# Patient Record
Sex: Female | Born: 1945 | Hispanic: Yes | Marital: Married | Smoking: Never smoker
Health system: Southern US, Community
[De-identification: ages and names within clinical notes are randomized; demographics above are authoritative.]

## PROBLEM LIST (undated history)

## (undated) DIAGNOSIS — R7303 Prediabetes: Secondary | ICD-10-CM

## (undated) DIAGNOSIS — M549 Dorsalgia, unspecified: Secondary | ICD-10-CM

## (undated) DIAGNOSIS — N189 Chronic kidney disease, unspecified: Secondary | ICD-10-CM

## (undated) DIAGNOSIS — Z86711 Personal history of pulmonary embolism: Secondary | ICD-10-CM

## (undated) HISTORY — PX: CHOLECYSTECTOMY: SHX55

---

## 2004-09-18 ENCOUNTER — Emergency Department: Payer: Self-pay | Admitting: Internal Medicine

## 2004-09-19 ENCOUNTER — Emergency Department: Payer: Self-pay | Admitting: Emergency Medicine

## 2010-02-07 ENCOUNTER — Inpatient Hospital Stay: Payer: Self-pay | Admitting: Internal Medicine

## 2010-02-14 ENCOUNTER — Other Ambulatory Visit: Payer: Self-pay | Admitting: Internal Medicine

## 2013-06-02 ENCOUNTER — Emergency Department: Payer: Self-pay | Admitting: Emergency Medicine

## 2013-06-02 LAB — URINALYSIS, COMPLETE
BACTERIA: NONE SEEN
BLOOD: NEGATIVE
Bilirubin,UR: NEGATIVE
Glucose,UR: NEGATIVE mg/dL (ref 0–75)
Ketone: NEGATIVE
Nitrite: NEGATIVE
Ph: 5 (ref 4.5–8.0)
Protein: 100
RBC,UR: 2 /HPF (ref 0–5)
Specific Gravity: 1.017 (ref 1.003–1.030)
Squamous Epithelial: 2
WBC UR: 21 /HPF (ref 0–5)

## 2013-06-02 LAB — BASIC METABOLIC PANEL
ANION GAP: 5 — AB (ref 7–16)
BUN: 35 mg/dL — ABNORMAL HIGH (ref 7–18)
CO2: 26 mmol/L (ref 21–32)
Calcium, Total: 8.5 mg/dL (ref 8.5–10.1)
Chloride: 107 mmol/L (ref 98–107)
Creatinine: 1.57 mg/dL — ABNORMAL HIGH (ref 0.60–1.30)
EGFR (African American): 40 — ABNORMAL LOW
EGFR (Non-African Amer.): 34 — ABNORMAL LOW
GLUCOSE: 86 mg/dL (ref 65–99)
OSMOLALITY: 283 (ref 275–301)
POTASSIUM: 3.7 mmol/L (ref 3.5–5.1)
SODIUM: 138 mmol/L (ref 136–145)

## 2013-06-02 LAB — CBC
HCT: 38.4 % (ref 35.0–47.0)
HGB: 12.4 g/dL (ref 12.0–16.0)
MCH: 27.3 pg (ref 26.0–34.0)
MCHC: 32.2 g/dL (ref 32.0–36.0)
MCV: 85 fL (ref 80–100)
Platelet: 213 10*3/uL (ref 150–440)
RBC: 4.54 10*6/uL (ref 3.80–5.20)
RDW: 14.3 % (ref 11.5–14.5)
WBC: 6.9 10*3/uL (ref 3.6–11.0)

## 2013-06-02 LAB — CK: CK, TOTAL: 42 U/L

## 2013-06-02 LAB — TROPONIN I: Troponin-I: <0.02 ng/mL

## 2017-04-18 ENCOUNTER — Emergency Department: Payer: Self-pay

## 2017-04-18 ENCOUNTER — Inpatient Hospital Stay
Admission: EM | Admit: 2017-04-18 | Discharge: 2017-04-20 | DRG: 194 | Disposition: A | Discharge status: Home / Self Care | Payer: Self-pay | Attending: Internal Medicine | Admitting: Internal Medicine

## 2017-04-18 ENCOUNTER — Encounter: Payer: Self-pay | Admitting: Emergency Medicine

## 2017-04-18 ENCOUNTER — Other Ambulatory Visit: Payer: Self-pay

## 2017-04-18 DIAGNOSIS — J189 Pneumonia, unspecified organism: Secondary | ICD-10-CM | POA: Diagnosis present

## 2017-04-18 DIAGNOSIS — E860 Dehydration: Secondary | ICD-10-CM | POA: Diagnosis present

## 2017-04-18 DIAGNOSIS — J101 Influenza due to other identified influenza virus with other respiratory manifestations: Secondary | ICD-10-CM | POA: Diagnosis present

## 2017-04-18 DIAGNOSIS — J1000 Influenza due to other identified influenza virus with unspecified type of pneumonia: Principal | ICD-10-CM | POA: Diagnosis present

## 2017-04-18 DIAGNOSIS — N183 Chronic kidney disease, stage 3 (moderate): Secondary | ICD-10-CM | POA: Diagnosis present

## 2017-04-18 DIAGNOSIS — Z86711 Personal history of pulmonary embolism: Secondary | ICD-10-CM

## 2017-04-18 DIAGNOSIS — Z23 Encounter for immunization: Secondary | ICD-10-CM

## 2017-04-18 DIAGNOSIS — N179 Acute kidney failure, unspecified: Secondary | ICD-10-CM | POA: Diagnosis present

## 2017-04-18 DIAGNOSIS — N189 Chronic kidney disease, unspecified: Secondary | ICD-10-CM | POA: Diagnosis present

## 2017-04-18 HISTORY — DX: Chronic kidney disease, unspecified: N18.9

## 2017-04-18 HISTORY — DX: Dorsalgia, unspecified: M54.9

## 2017-04-18 HISTORY — DX: Personal history of pulmonary embolism: Z86.711

## 2017-04-18 HISTORY — DX: Prediabetes: R73.03

## 2017-04-18 LAB — COMPREHENSIVE METABOLIC PANEL
ALBUMIN: 2.9 g/dL — AB (ref 3.5–5.0)
ALK PHOS: 236 U/L — AB (ref 38–126)
ALT: 48 U/L (ref 14–54)
AST: 66 U/L — AB (ref 15–41)
Anion gap: 8 (ref 5–15)
BILIRUBIN TOTAL: 0.6 mg/dL (ref 0.3–1.2)
BUN: 48 mg/dL — AB (ref 6–20)
CALCIUM: 8.3 mg/dL — AB (ref 8.9–10.3)
CO2: 22 mmol/L (ref 22–32)
CREATININE: 2.59 mg/dL — AB (ref 0.44–1.00)
Chloride: 103 mmol/L (ref 101–111)
GFR calc Af Amer: 21 mL/min — ABNORMAL LOW (ref >60)
GFR, EST NON AFRICAN AMERICAN: 18 mL/min — AB (ref >60)
GLUCOSE: 111 mg/dL — AB (ref 65–99)
Potassium: 4.2 mmol/L (ref 3.5–5.1)
Sodium: 133 mmol/L — ABNORMAL LOW (ref 135–145)
TOTAL PROTEIN: 7.1 g/dL (ref 6.5–8.1)

## 2017-04-18 LAB — CBC WITH DIFFERENTIAL/PLATELET
BAND NEUTROPHILS: 12 %
BASOS ABS: 0 10*3/uL (ref 0–0.1)
BASOS PCT: 0 %
BLASTS: 0 %
Eosinophils Absolute: 0 10*3/uL (ref 0–0.7)
Eosinophils Relative: 0 %
HCT: 38.8 % (ref 35.0–47.0)
HEMOGLOBIN: 12.8 g/dL (ref 12.0–16.0)
Lymphocytes Relative: 6 %
Lymphs Abs: 0.8 10*3/uL — ABNORMAL LOW (ref 1.0–3.6)
MCH: 28.2 pg (ref 26.0–34.0)
MCHC: 32.9 g/dL (ref 32.0–36.0)
MCV: 85.6 fL (ref 80.0–100.0)
METAMYELOCYTES PCT: 0 %
MONO ABS: 0.9 10*3/uL (ref 0.2–0.9)
MYELOCYTES: 0 %
Monocytes Relative: 7 %
Neutro Abs: 11.4 10*3/uL — ABNORMAL HIGH (ref 1.4–6.5)
Neutrophils Relative %: 75 %
Other: 0 %
PLATELETS: 157 10*3/uL (ref 150–440)
PROMYELOCYTES ABS: 0 %
RBC: 4.53 MIL/uL (ref 3.80–5.20)
RDW: 14.5 % (ref 11.5–14.5)
WBC: 13.1 10*3/uL — ABNORMAL HIGH (ref 3.6–11.0)
nRBC: 0 /100 WBC

## 2017-04-18 LAB — URINALYSIS, COMPLETE (UACMP) WITH MICROSCOPIC
Bacteria, UA: NONE SEEN
Bilirubin Urine: NEGATIVE
Glucose, UA: NEGATIVE mg/dL
Ketones, ur: NEGATIVE mg/dL
LEUKOCYTES UA: NEGATIVE
Nitrite: NEGATIVE
PH: 5 (ref 5.0–8.0)
Protein, ur: 100 mg/dL — AB
SPECIFIC GRAVITY, URINE: 1.019 (ref 1.005–1.030)

## 2017-04-18 LAB — LACTIC ACID, PLASMA: Lactic Acid, Venous: 1 mmol/L (ref 0.5–1.9)

## 2017-04-18 LAB — INFLUENZA PANEL BY PCR (TYPE A & B)
INFLAPCR: POSITIVE — AB
INFLBPCR: NEGATIVE

## 2017-04-18 LAB — LIPASE, BLOOD: LIPASE: 102 U/L — AB (ref 11–51)

## 2017-04-18 LAB — PROCALCITONIN: PROCALCITONIN: 0.95 ng/mL

## 2017-04-18 MED ORDER — CEFTRIAXONE SODIUM IN DEXTROSE 20 MG/ML IV SOLN
1.0000 g | INTRAVENOUS | Status: DC
  Filled 2017-04-18: qty 50

## 2017-04-18 MED ORDER — DEXTROSE 5 % IV SOLN
500.0000 mg | INTRAVENOUS | Status: DC
  Administered 2017-04-19: 500 mg via INTRAVENOUS
  Filled 2017-04-18 (×2): qty 500

## 2017-04-18 MED ORDER — SODIUM CHLORIDE 0.9 % IV BOLUS (SEPSIS)
1000.0000 mL | Freq: Once | INTRAVENOUS | Status: AC
  Administered 2017-04-18: 1000 mL via INTRAVENOUS

## 2017-04-18 MED ORDER — PNEUMOCOCCAL VAC POLYVALENT 25 MCG/0.5ML IJ INJ
0.5000 mL | INJECTION | INTRAMUSCULAR | Status: AC
  Administered 2017-04-19: 0.5 mL via INTRAMUSCULAR
  Filled 2017-04-18: qty 0.5

## 2017-04-18 MED ORDER — DEXTROSE 5 % IV SOLN
INTRAVENOUS | Status: AC
  Filled 2017-04-18: qty 500

## 2017-04-18 MED ORDER — CEFTRIAXONE SODIUM IN DEXTROSE 20 MG/ML IV SOLN
1.0000 g | Freq: Once | INTRAVENOUS | Status: AC
  Administered 2017-04-18: 1 g via INTRAVENOUS
  Filled 2017-04-18: qty 50

## 2017-04-18 MED ORDER — AZITHROMYCIN 500 MG IV SOLR
500.0000 mg | Freq: Once | INTRAVENOUS | Status: AC
  Administered 2017-04-18: 500 mg via INTRAVENOUS
  Filled 2017-04-18: qty 500

## 2017-04-18 MED ORDER — ACETAMINOPHEN 325 MG PO TABS
650.0000 mg | ORAL_TABLET | Freq: Four times a day (QID) | ORAL | Status: DC | PRN
Start: 2017-04-18 — End: 2017-04-20
  Administered 2017-04-19 – 2017-04-20 (×2): 650 mg via ORAL
  Filled 2017-04-18 (×2): qty 2

## 2017-04-18 MED ORDER — HEPARIN SODIUM (PORCINE) 5000 UNIT/ML IJ SOLN
5000.0000 [IU] | Freq: Three times a day (TID) | INTRAMUSCULAR | Status: DC
  Administered 2017-04-19 – 2017-04-20 (×4): 5000 [IU] via SUBCUTANEOUS
  Filled 2017-04-18 (×4): qty 1

## 2017-04-18 MED ORDER — ACETAMINOPHEN 650 MG RE SUPP: 650.0000 mg | Freq: Four times a day (QID) | RECTAL | Status: DC | PRN

## 2017-04-18 MED ORDER — GUAIFENESIN-DM 100-10 MG/5ML PO SYRP
5.0000 mL | ORAL_SOLUTION | ORAL | Status: DC | PRN
  Administered 2017-04-19 – 2017-04-20 (×2): 5 mL via ORAL
  Filled 2017-04-18 (×2): qty 5

## 2017-04-18 MED ORDER — IBUPROFEN 400 MG PO TABS: 400.0000 mg | ORAL_TABLET | Freq: Four times a day (QID) | ORAL | Status: DC | PRN

## 2017-04-18 MED ORDER — ONDANSETRON HCL 4 MG/2ML IJ SOLN: 4.0000 mg | Freq: Four times a day (QID) | INTRAMUSCULAR | Status: DC | PRN

## 2017-04-18 MED ORDER — SODIUM CHLORIDE 0.9 % IV SOLN
INTRAVENOUS | Status: AC
  Administered 2017-04-18: via INTRAVENOUS

## 2017-04-18 MED ORDER — INFLUENZA VAC SPLIT HIGH-DOSE 0.5 ML IM SUSY
0.5000 mL | PREFILLED_SYRINGE | INTRAMUSCULAR | Status: AC
  Administered 2017-04-19: 0.5 mL via INTRAMUSCULAR
  Filled 2017-04-18: qty 0.5

## 2017-04-18 MED ORDER — ONDANSETRON HCL 4 MG PO TABS: 4.0000 mg | ORAL_TABLET | Freq: Four times a day (QID) | ORAL | Status: DC | PRN

## 2017-04-18 NOTE — H&P (Signed)
[facility] - [place] at [facility]   PATIENT NAME: Betty ClicheMatilde Leon Silva    MR#:  [id number]  DATE OF BIRTH:  1945-10-08  DATE OF ADMISSION:  04/18/2017  PRIMARY CARE PHYSICIAN: Patient, No Pcp Per   REQUESTING/REFERRING PHYSICIAN: Scotty CourtStafford, MD  CHIEF COMPLAINT:   Chief Complaint  Patient presents with  . Fever  . Cough    HISTORY OF PRESENT ILLNESS:  Betty Silva  is a 72 y.o. female who presents with 4 days of increasing cough, chills, myalgias.  Patient found here to be influenza A positive.  Also found to have pneumonia on chest x-ray.  Hospitalist were called for admission  PAST MEDICAL HISTORY:   Past Medical History:  Diagnosis Date  . Back pain .  Marland Kitchen. CKD (chronic kidney disease)   . History of pulmonary embolism   . Prediabetes     PAST SURGICAL HISTORY:   Past Surgical History:  Procedure Laterality Date  . CHOLECYSTECTOMY      SOCIAL HISTORY:   Social History   Tobacco Use  . Smoking status: Never Smoker  . Smokeless tobacco: Never Used  Substance Use Topics  . Alcohol use: No    Frequency: Never    FAMILY HISTORY:   Family History  Problem Relation Age of Onset  . Diabetes Neg Hx   . Hypertension Neg Hx     DRUG ALLERGIES:  No Known Allergies  MEDICATIONS AT HOME:   Prior to Admission medications   Not on File    REVIEW OF SYSTEMS:  Review of Systems  Constitutional: Positive for chills and malaise/fatigue. Negative for fever and weight loss.  HENT: Negative for ear pain, hearing loss and tinnitus.   Eyes: Negative for blurred vision, double vision, pain and redness.  Respiratory: Positive for cough, sputum production and shortness of breath. Negative for hemoptysis.   Cardiovascular: Negative for chest pain, palpitations, orthopnea and leg swelling.  Gastrointestinal: Negative for abdominal pain, constipation, diarrhea, nausea and vomiting.  Genitourinary: Negative for dysuria, frequency and  hematuria.  Musculoskeletal: Positive for myalgias. Negative for back pain, joint pain and neck pain.  Skin:       No acne, rash, or lesions  Neurological: Negative for dizziness, tremors, focal weakness and weakness.  Endo/Heme/Allergies: Negative for polydipsia. Does not bruise/bleed easily.  Psychiatric/Behavioral: Negative for depression. The patient is not nervous/anxious and does not have insomnia.      VITAL SIGNS:   Vitals:   04/18/17 1618 04/18/17 1900 04/18/17 1930  BP: (!) 143/77 128/80 119/71  Pulse: (!) 104 97 90  Resp: 18    Temp: (!) 101.9 F (38.8 C)    TempSrc: Oral    SpO2: 95% 94% 96%  Weight: 65.8 kg (145 lb)     Wt Readings from Last 3 Encounters:  04/18/17 65.8 kg (145 lb)    PHYSICAL EXAMINATION:  Physical Exam  Vitals reviewed. Constitutional: She is oriented to person, place, and time. She appears well-developed and well-nourished. No distress.  HENT:  Head: Normocephalic and atraumatic.  Mouth/Throat: Oropharynx is clear and moist.  Eyes: Conjunctivae and EOM are normal. Pupils are equal, round, and reactive to light. No scleral icterus.  Neck: Normal range of motion. Neck supple. No JVD present. No thyromegaly present.  Cardiovascular: Normal rate, regular rhythm and intact distal pulses. Exam reveals no gallop and no friction rub.  No murmur heard. Respiratory: Effort normal. No respiratory distress. She has no wheezes. She has no rales.  Rhonchi  GI: Soft. Bowel sounds are normal. She exhibits no distension. There is no tenderness.  Musculoskeletal: Normal range of motion. She exhibits no edema.  No arthritis, no gout  Lymphadenopathy:    She has no cervical adenopathy.  Neurological: She is alert and oriented to person, place, and time. No cranial nerve deficit.  No dysarthria, no aphasia  Skin: Skin is warm and dry. No rash noted. No erythema.  Psychiatric: She has a normal mood and affect. Her behavior is normal. Judgment and thought  content normal.    LABORATORY PANEL:   CBC Recent Labs  Lab 04/18/17 1706  WBC 13.1*  HGB 12.8  HCT 38.8  PLT 157   ------------------------------------------------------------------------------------------------------------------  Chemistries  Recent Labs  Lab 04/18/17 1706  NA 133*  K 4.2  CL 103  CO2 22  GLUCOSE 111*  BUN 48*  CREATININE 2.59*  CALCIUM 8.3*  AST 66*  ALT 48  ALKPHOS 236*  BILITOT 0.6   ------------------------------------------------------------------------------------------------------------------  Cardiac Enzymes No results for input(s): TROPONINI in the last 168 hours. ------------------------------------------------------------------------------------------------------------------  RADIOLOGY:  Dg Chest 2 View  Result Date: 04/18/2017 CLINICAL DATA:  Productive cough, fever, and chills. EXAM: CHEST  2 VIEW COMPARISON:  06/02/2013 FINDINGS: The heart size and mediastinal contours are within normal limits. Background chronic interstitial lung disease and bibasilar scarring noted. Increased superimposed airspace opacity is seen in the right lower lobe, suspicious for pneumonia. No evidence of pleural effusion. IMPRESSION: Increased right lower lobe airspace opacity, suspicious for pneumonia. Underlying chronic interstitial lung disease and bibasilar scarring. Electronically Signed   By: Myles Rosenthal M.D.   On: 04/18/2017 17:12    EKG:  No orders found for this or any previous visit.  IMPRESSION AND PLAN:  Principal Problem:   Influenza A -patient is beyond the window for Tamiflu, will give supportive treatment Active Problems:   Acute on chronic renal failure (HCC) -likely prerenal due to infection and dehydration, gentle IV fluids tonight and avoid nephrotoxins   Secondary pneumonia -IV antibiotics, supportive treatment  All the records are reviewed and case discussed with ED provider. Management plans discussed with the patient and/or  family.  DVT PROPHYLAXIS: SubQ heparin  GI PROPHYLAXIS: None  ADMISSION STATUS: Inpatient  CODE STATUS: Full Code Status History    This patient does not have a recorded code status. Please follow your organizational policy for patients in this situation.      TOTAL TIME TAKING CARE OF THIS PATIENT: 45 minutes.   Betty Silva 04/18/2017, 10:23 PM  [facility]  Office  [phone]  CC: Primary care physician; Patient, No Pcp Per  Note:  This document was prepared using Dragon voice recognition software and may include unintentional dictation errors.

## 2017-04-18 NOTE — Progress Notes (Signed)
Pharmacy Antibiotic Note  Yanel Meriel FlavorsLeon Calderon is a 72 y.o. female admitted on 04/18/2017 with pneumonia.  Pharmacy has been consulted for ceftriaxione/azithromycin dosing.  Plan: azithromycin 500mg  iv q24h, ceftriaxone 1gm iv q24h   Weight: 145 lb (65.8 kg)  Temp (24hrs), Avg:101.9 F (38.8 C), Min:101.9 F (38.8 C), Max:101.9 F (38.8 C)  No results for input(s): WBC, CREATININE, LATICACIDVEN, VANCOTROUGH, VANCOPEAK, VANCORANDOM, GENTTROUGH, GENTPEAK, GENTRANDOM, TOBRATROUGH, TOBRAPEAK, TOBRARND, AMIKACINPEAK, AMIKACINTROU, AMIKACIN in the last 168 hours.  CrCl cannot be calculated (Patient's most recent lab result is older than the maximum 21 days allowed.).    No Known Allergies  Antimicrobials this admission: Anti-infectives (From admission, onward)   Start     Dose/Rate Route Frequency Ordered Stop   04/19/17 1600  azithromycin (ZITHROMAX) 500 mg in dextrose 5 % 250 mL IVPB     500 mg 250 mL/hr over 60 Minutes Intravenous Every 24 hours 04/18/17 1650     04/19/17 1600  cefTRIAXone (ROCEPHIN) 1 g in dextrose 5 % 50 mL IVPB - Premix     1 g 100 mL/hr over 30 Minutes Intravenous Every 24 hours 04/18/17 1650     04/18/17 1700  cefTRIAXone (ROCEPHIN) 1 g in dextrose 5 % 50 mL IVPB - Premix     1 g 100 mL/hr over 30 Minutes Intravenous  Once 04/18/17 1645     04/18/17 1700  azithromycin (ZITHROMAX) 500 mg in dextrose 5 % 250 mL IVPB     500 mg 250 mL/hr over 60 Minutes Intravenous  Once 04/18/17 1645        Microbiology results: No results found for this or any previous visit (from the past 240 hour(s)).  Thank you for allowing pharmacy to be a part of this patient's care.  Gerre PebblesGarrett Brittinie Wherley 04/18/2017 4:50 PM

## 2017-04-18 NOTE — ED Provider Notes (Signed)
[facility] Emergency Department Provider Note  ____________________________________________  Time seen: Approximately 7:03 PM  I have reviewed the triage vital signs and the nursing notes.   HISTORY  Chief Complaint Fever and Cough  Encounter completed with Spanish interpreter at bedside  HPI Betty Silva is a 72 y.o. female who complains of fever chills and productive cough for the past 2 days. She also is short of breath, worse with walking. Symptoms are constant, no aggravating or alleviating factors, moderate severity. No radiating pain.     History reviewed. No pertinent past medical history. Denies.  There are no active problems to display for this patient.    Past Surgical History:  Procedure Laterality Date  . CHOLECYSTECTOMY       Prior to Admission medications   Not on File  None   Allergies Patient has no known allergies.   No family history on file.  Social History Social History   Tobacco Use  . Smoking status: Never Smoker  . Smokeless tobacco: Never Used  Substance Use Topics  . Alcohol use: No    Frequency: Never  . Drug use: No    Review of Systems  Constitutional:   Positive fever and chills.  ENT:   No sore throat. No rhinorrhea. Cardiovascular:   No chest pain or syncope. Respiratory:   Positive shortness of breath and productive cough. Gastrointestinal:   Negative for abdominal pain, vomiting and diarrhea.  Musculoskeletal:   Negative for focal pain or swelling All other systems reviewed and are negative except as documented above in ROS and HPI.  ____________________________________________   PHYSICAL EXAM:  VITAL SIGNS: ED Triage Vitals  Enc Vitals Group     BP 04/18/17 1618 (!) 143/77     Pulse Rate 04/18/17 1618 (!) 104     Resp 04/18/17 1618 18     Temp 04/18/17 1618 (!) 101.9 F (38.8 C)     Temp Source 04/18/17 1618 Oral     SpO2 04/18/17 1618 95 %     Weight 04/18/17 1618  145 lb (65.8 kg)     Height --      Head Circumference --      Peak Flow --      Pain Score 04/18/17 1621 5     Pain Loc --      Pain Edu? --      Excl. in GC? --     Vital signs reviewed, nursing assessments reviewed.   Constitutional:   Alert and oriented. Well appearing and in no distress. Eyes:   No scleral icterus.  EOMI. No nystagmus. No conjunctival pallor. PERRL. ENT   Head:   Normocephalic and atraumatic.   Nose:   No congestion/rhinnorhea.    Mouth/Throat:   MMM, no pharyngeal erythema. No peritonsillar mass.    Neck:   No meningismus. Full ROM. Hematological/Lymphatic/Immunilogical:   No cervical lymphadenopathy. Cardiovascular:   Tachycardia heart rate 105. Symmetric bilateral radial and DP pulses.  No murmurs.  Respiratory:   Normal respiratory effort without tachypnea/retractions. Right lower lung crackles. No wheezes/rales/rhonchi. Gastrointestinal:   Soft and nontender. Non distended. There is no CVA tenderness.  No rebound, rigidity, or guarding. Genitourinary:   deferred Musculoskeletal:   Normal range of motion in all extremities. No joint effusions.  No lower extremity tenderness.  No edema. Neurologic:   Normal speech and language.  Motor grossly intact. No acute focal neurologic deficits are appreciated.  Skin:    Skin is warm, dry  and intact. No rash noted.  No petechiae, purpura, or bullae.  ____________________________________________    LABS (pertinent positives/negatives) (all labs ordered are listed, but only abnormal results are displayed) Labs Reviewed  COMPREHENSIVE METABOLIC PANEL - Abnormal; Notable for the following components:      Result Value   Sodium 133 (*)    Glucose, Bld 111 (*)    BUN 48 (*)    Creatinine, Ser 2.59 (*)    Calcium 8.3 (*)    Albumin 2.9 (*)    AST 66 (*)    Alkaline Phosphatase 236 (*)    GFR calc non Af Amer 18 (*)    GFR calc Af Amer 21 (*)    All other components within normal limits  LIPASE,  BLOOD - Abnormal; Notable for the following components:   Lipase 102 (*)    All other components within normal limits  CBC WITH DIFFERENTIAL/PLATELET - Abnormal; Notable for the following components:   WBC 13.1 (*)    Neutro Abs 11.4 (*)    Lymphs Abs 0.8 (*)    All other components within normal limits  URINALYSIS, COMPLETE (UACMP) WITH MICROSCOPIC - Abnormal; Notable for the following components:   Color, Urine YELLOW (*)    APPearance CLOUDY (*)    Hgb urine dipstick SMALL (*)    Protein, ur 100 (*)    Squamous Epithelial / LPF 0-5 (*)    All other components within normal limits  INFLUENZA PANEL BY PCR (TYPE A & B) - Abnormal; Notable for the following components:   Influenza A By PCR POSITIVE (*)    All other components within normal limits  CULTURE, BLOOD (ROUTINE X 2)  CULTURE, BLOOD (ROUTINE X 2)  URINE CULTURE  LACTIC ACID, PLASMA  LACTIC ACID, PLASMA  PROCALCITONIN   ____________________________________________   EKG    ____________________________________________    RADIOLOGY  Dg Chest 2 View  Result Date: 04/18/2017 CLINICAL DATA:  Productive cough, fever, and chills. EXAM: CHEST  2 VIEW COMPARISON:  06/02/2013 FINDINGS: The heart size and mediastinal contours are within normal limits. Background chronic interstitial lung disease and bibasilar scarring noted. Increased superimposed airspace opacity is seen in the right lower lobe, suspicious for pneumonia. No evidence of pleural effusion. IMPRESSION: Increased right lower lobe airspace opacity, suspicious for pneumonia. Underlying chronic interstitial lung disease and bibasilar scarring. Electronically Signed   By: Myles Rosenthal M.D.   On: 04/18/2017 17:12    ____________________________________________   PROCEDURES Procedures  ____________________________________________    CLINICAL IMPRESSION / ASSESSMENT AND PLAN / ED COURSE  Pertinent labs & imaging results that were available during my care of the  patient were reviewed by me and considered in my medical decision making (see chart for details).     Clinical Course as of Apr 18 2138  Sun Apr 18, 2017  1645 Pt p/w SIRS, suspected pnemonia. SO2 91% on my exam. Sepsis workup initiated  [PS]  1901 Labs show acute on chronic renal insufficiency with unremarkable electrolytes. Influenza A positive. Leukocytosis. We will discuss with the patient regarding hospitalization given her elderly age and worsening renal failure as a consultation of influenza along with her borderline low normal oxygen level.  [PS]  1909 Pro-calcitonin suggest that patient may have underlying actual pneumonia as well as influenza. Received IV antibiotics for community-acquired pneumonia. Procalcitonin: 0.95 [PS]  1954 Vital signs stable. We'll discuss with hospitalist. Procalcitonin  elevation can be indicative of severe sepsis, but currently labs of vital signs do  not reveal septic shock.  [PS]    Clinical Course User Index [PS] Sharman CheekStafford, Jovanni Rash, MD     ____________________________________________   FINAL CLINICAL IMPRESSION(S) / ED DIAGNOSES    Final diagnoses:  Acute renal failure superimposed on chronic kidney disease, unspecified CKD stage, unspecified acute renal failure type (HCC)  Influenza A       Portions of this note were generated with dragon dictation software. Dictation errors may occur despite best attempts at proofreading.    Sharman CheekStafford, Dajahnae Vondra, MD 04/18/17 2141

## 2017-04-18 NOTE — ED Triage Notes (Signed)
Pt to ED via POV states that she has been having fever, chills, and productive cough with yellow sputum. Pt able to speak in complete sentences. Pt in NAD at this time.

## 2017-04-19 LAB — CBC
HEMATOCRIT: 32.2 % — AB (ref 35.0–47.0)
Hemoglobin: 10.5 g/dL — ABNORMAL LOW (ref 12.0–16.0)
MCH: 28 pg (ref 26.0–34.0)
MCHC: 32.6 g/dL (ref 32.0–36.0)
MCV: 85.9 fL (ref 80.0–100.0)
Platelets: 140 10*3/uL — ABNORMAL LOW (ref 150–440)
RBC: 3.75 MIL/uL — ABNORMAL LOW (ref 3.80–5.20)
RDW: 14.7 % — AB (ref 11.5–14.5)
WBC: 8.9 10*3/uL (ref 3.6–11.0)

## 2017-04-19 LAB — BASIC METABOLIC PANEL
ANION GAP: 7 (ref 5–15)
BUN: 45 mg/dL — AB (ref 6–20)
CALCIUM: 7.7 mg/dL — AB (ref 8.9–10.3)
CO2: 20 mmol/L — ABNORMAL LOW (ref 22–32)
Chloride: 109 mmol/L (ref 101–111)
Creatinine, Ser: 2.44 mg/dL — ABNORMAL HIGH (ref 0.44–1.00)
GFR calc Af Amer: 22 mL/min — ABNORMAL LOW (ref >60)
GFR, EST NON AFRICAN AMERICAN: 19 mL/min — AB (ref >60)
GLUCOSE: 96 mg/dL (ref 65–99)
POTASSIUM: 4.1 mmol/L (ref 3.5–5.1)
SODIUM: 136 mmol/L (ref 135–145)

## 2017-04-19 MED ORDER — SENNOSIDES-DOCUSATE SODIUM 8.6-50 MG PO TABS
2.0000 | ORAL_TABLET | Freq: Two times a day (BID) | ORAL | Status: DC
  Administered 2017-04-19: 2 via ORAL
  Filled 2017-04-19: qty 2

## 2017-04-19 MED ORDER — SODIUM CHLORIDE 0.9 % IV SOLN
INTRAVENOUS | Status: DC
  Administered 2017-04-19 – 2017-04-20 (×2): via INTRAVENOUS

## 2017-04-19 MED ORDER — CEFTRIAXONE SODIUM 1 G IJ SOLR
1.0000 g | INTRAMUSCULAR | Status: DC
  Administered 2017-04-19: 1 g via INTRAVENOUS
  Filled 2017-04-19 (×2): qty 10

## 2017-04-19 NOTE — Progress Notes (Signed)
[facility] - [place] at [facility]   PATIENT NAME: Betty Silva    MR#:  [id number]  DATE OF BIRTH:  [birth year]  SUBJECTIVE:  CHIEF COMPLAINT:   Chief Complaint  Patient presents with  . Fever  . Cough  still coughing, grand daughter at bedside,  REVIEW OF SYSTEMS:  Review of Systems  Constitutional: Negative for chills, fever and weight loss.  HENT: Negative for nosebleeds and sore throat.   Eyes: Negative for blurred vision.  Respiratory: Positive for cough. Negative for shortness of breath and wheezing.   Cardiovascular: Negative for chest pain, orthopnea, leg swelling and PND.  Gastrointestinal: Negative for abdominal pain, constipation, diarrhea, heartburn, nausea and vomiting.  Genitourinary: Negative for dysuria and urgency.  Musculoskeletal: Negative for back pain.  Skin: Negative for rash.  Neurological: Negative for dizziness, speech change, focal weakness and headaches.  Endo/Heme/Allergies: Does not bruise/bleed easily.  Psychiatric/Behavioral: Negative for depression.   DRUG ALLERGIES:  No Known Allergies VITALS:  Blood pressure (!) 116/58, pulse 68, temperature 98.5 F (36.9 C), temperature source Oral, resp. rate 18, height 5\' 3"  (1.6 m), weight 65.2 kg (143 lb 12.8 oz), SpO2 96 %. PHYSICAL EXAMINATION:  Physical Exam  Constitutional: She is oriented to person, place, and time and well-developed, well-nourished, and in no distress.  HENT:  Head: Normocephalic and atraumatic.  Eyes: Conjunctivae and EOM are normal. Pupils are equal, round, and reactive to light.  Neck: Normal range of motion. Neck supple. No tracheal deviation present. No thyromegaly present.  Cardiovascular: Normal rate, regular rhythm and normal heart sounds.  Pulmonary/Chest: Effort normal and breath sounds normal. No respiratory distress. She has no wheezes. She exhibits no tenderness.  Abdominal: Soft. Bowel sounds are normal. She exhibits no distension.  There is no tenderness.  Musculoskeletal: Normal range of motion.  Neurological: She is alert and oriented to person, place, and time. No cranial nerve deficit.  Skin: Skin is warm and dry. No rash noted.  Psychiatric: Mood and affect normal.   LABORATORY PANEL:  Female CBC Recent Labs  Lab 04/19/17 0534  WBC 8.9  HGB 10.5*  HCT 32.2*  PLT 140*   ------------------------------------------------------------------------------------------------------------------ Chemistries  Recent Labs  Lab 04/18/17 1706 04/19/17 0534  NA 133* 136  K 4.2 4.1  CL 103 109  CO2 22 20*  GLUCOSE 111* 96  BUN 48* 45*  CREATININE 2.59* 2.44*  CALCIUM 8.3* 7.7*  AST 66*  --   ALT 48  --   ALKPHOS 236*  --   BILITOT 0.6  --    RADIOLOGY:  No results found. ASSESSMENT AND PLAN:   * Influenza A - supportive treatment * Acute on chronic renal failure (HCC) -likely prerenal due to infection and dehydration, gentle IV fluids tonight and avoid nephrotoxins - baseline creat around 1.5, now 2.44   * pneumonia - continue IV rocephin + zithromax, supportive treatment  * Thromocytopenia: monitor     All the records are reviewed and case discussed with Care Management/Social Worker. Management plans discussed with the patient, family (grand daughter at bedside)and they are in agreement.  CODE STATUS: Full Code  TOTAL TIME TAKING CARE OF THIS PATIENT: 35 minutes.   More than 50% of the time was spent in counseling/coordination of care: YES  POSSIBLE D/C IN 1 DAYS, DEPENDING ON CLINICAL CONDITION.   Delfino Lovett M.D on 04/19/2017 at 7:50 PM  Between 7am to 6pm - Pager - [phone]  After 6pm go to [email or web address] - password  [email or web address]  [facility] [place] [facility]  Office  [phone]  CC: Primary care physician; Patient, No Pcp Per  Note: This dictation was prepared with Dragon dictation along with smaller phrase technology. Any transcriptional errors that result from this  process are unintentional.

## 2017-04-20 LAB — BASIC METABOLIC PANEL
Anion gap: 5 (ref 5–15)
BUN: 39 mg/dL — AB (ref 6–20)
CALCIUM: 8.3 mg/dL — AB (ref 8.9–10.3)
CO2: 21 mmol/L — ABNORMAL LOW (ref 22–32)
CREATININE: 2.02 mg/dL — AB (ref 0.44–1.00)
Chloride: 112 mmol/L — ABNORMAL HIGH (ref 101–111)
GFR calc Af Amer: 28 mL/min — ABNORMAL LOW (ref >60)
GFR, EST NON AFRICAN AMERICAN: 24 mL/min — AB (ref >60)
GLUCOSE: 109 mg/dL — AB (ref 65–99)
Potassium: 4.2 mmol/L (ref 3.5–5.1)
SODIUM: 138 mmol/L (ref 135–145)

## 2017-04-20 LAB — CBC
HCT: 34.2 % — ABNORMAL LOW (ref 35.0–47.0)
Hemoglobin: 11.2 g/dL — ABNORMAL LOW (ref 12.0–16.0)
MCH: 28.3 pg (ref 26.0–34.0)
MCHC: 32.7 g/dL (ref 32.0–36.0)
MCV: 86.4 fL (ref 80.0–100.0)
PLATELETS: 146 10*3/uL — AB (ref 150–440)
RBC: 3.95 MIL/uL (ref 3.80–5.20)
RDW: 14.4 % (ref 11.5–14.5)
WBC: 9.7 10*3/uL (ref 3.6–11.0)

## 2017-04-20 LAB — URINE CULTURE

## 2017-04-20 MED ORDER — LEVOFLOXACIN 750 MG PO TABS: 750.0000 mg | ORAL_TABLET | Freq: Every day | ORAL | 0 refills | Status: AC

## 2017-04-20 MED ORDER — POLYETHYLENE GLYCOL 3350 17 G PO PACK
17.0000 g | PACK | ORAL | Status: AC
  Administered 2017-04-20: 17 g via ORAL
  Filled 2017-04-20: qty 1

## 2017-04-20 MED ORDER — SENNOSIDES-DOCUSATE SODIUM 8.6-50 MG PO TABS
2.0000 | ORAL_TABLET | ORAL | Status: AC
  Administered 2017-04-20: 2 via ORAL
  Filled 2017-04-20: qty 2

## 2017-04-20 NOTE — Progress Notes (Signed)
Pt in no acute distress. VSS. Pt educated on discharge education with telephone interpretor present. Pt verbalized understanding. Wheeled to visitors entrance by volunteer services and assisted into car.

## 2017-04-20 NOTE — Progress Notes (Signed)
Pharmacy Antibiotic Note  Betty Silva is a 72 y.o. female admitted on 04/18/2017 with pneumonia and influenza.  Pharmacy has been consulted for ceftriaxone + azithromycin dosing.  This is day #3 of antibiotics  Plan: Continue ceftriaxone 1 g IV daily and azithromycin 500 mg IV daily  Height: 5\' 3"  (160 cm) Weight: 143 lb 12.8 oz (65.2 kg) IBW/kg (Calculated) : 52.4  Temp (24hrs), Avg:100.7 F (38.2 C), Min:98.5 F (36.9 C), Max:102.5 F (39.2 C)  Recent Labs  Lab 04/18/17 1706 04/19/17 0534 04/20/17 0357  WBC 13.1* 8.9 9.7  CREATININE 2.59* 2.44* 2.02*  LATICACIDVEN 1.0  --   --     Estimated Creatinine Clearance: 23.9 mL/min (A) (by C-G formula based on SCr of 2.02 mg/dL (H)).    No Known Allergies  Antimicrobials this admission: ceftriaxone 1/27 >>  azithromycin 1/27 >>   Dose adjustments this admission:  Microbiology results: 1/27 BCx: No growth 2 days 1/27 UCx: Sent   Thank you for allowing pharmacy to be a part of this patient's care.  Betty Silva, PharmD, BCPS Clinical Pharmacist 04/20/2017 7:56 AM

## 2017-04-20 NOTE — Discharge Instructions (Signed)

## 2017-04-22 LAB — URINE CULTURE: Culture: NO GROWTH

## 2017-04-23 LAB — CULTURE, BLOOD (ROUTINE X 2)
Culture: NO GROWTH
Culture: NO GROWTH
SPECIMEN DESCRIPTION: ADEQUATE

## 2017-04-23 NOTE — Discharge Summary (Signed)
[facility] [place] [facility]   PATIENT NAME: Betty Silva    MR#:  [id number]  DATE OF BIRTH:  1945/03/30  DATE OF ADMISSION:  04/18/2017   ADMITTING PHYSICIAN: Oralia Manis, MD  DATE OF DISCHARGE: 04/20/2017  3:39 PM  PRIMARY CARE PHYSICIAN: Patient, No Pcp Per   ADMISSION DIAGNOSIS:  Influenza A [J10.1] Acute renal failure superimposed on chronic kidney disease, unspecified CKD stage, unspecified acute renal failure type (HCC) [N17.9, N18.9] DISCHARGE DIAGNOSIS:  Principal Problem:   Influenza A Active Problems:   Acute on chronic renal failure (HCC)   Secondary pneumonia  SECONDARY DIAGNOSIS:   Past Medical History:  Diagnosis Date  . Back pain .  Marland Kitchen CKD (chronic kidney disease)   . History of pulmonary embolism   . Prediabetes    HOSPITAL COURSE:  * Influenza A - supportive treatment, improving. *Acute on chronic kidney disease 3-likely prerenal due to infection and dehydration, improving with hydration. * pneumonia - improving. Finish 7 day course of PO levaquin * Thromocytopenia: stable DISCHARGE CONDITIONS:  stable CONSULTS OBTAINED:   DRUG ALLERGIES:  No Known Allergies DISCHARGE MEDICATIONS:   Allergies as of 04/20/2017   No Known Allergies     Medication List    TAKE these medications   levofloxacin 750 MG tablet Commonly known as:  LEVAQUIN Take 1 tablet (750 mg total) by mouth daily for 7 days.        DISCHARGE INSTRUCTIONS:   DIET:  Regular diet DISCHARGE CONDITION:  Good ACTIVITY:  Activity as tolerated OXYGEN:  Home Oxygen: No.  Oxygen Delivery: room air DISCHARGE LOCATION:  home   If you experience worsening of your admission symptoms, develop shortness of breath, life threatening emergency, suicidal or homicidal thoughts you must seek medical attention immediately by calling 911 or calling your MD immediately  if symptoms less severe.  You Must read complete instructions/literature along  with all the possible adverse reactions/side effects for all the Medicines you take and that have been prescribed to you. Take any new Medicines after you have completely understood and accpet all the possible adverse reactions/side effects.   Please note  You were cared for by a hospitalist during your hospital stay. If you have any questions about your discharge medications or the care you received while you were in the hospital after you are discharged, you can call the unit and asked to speak with the hospitalist on call if the hospitalist that took care of you is not available. Once you are discharged, your primary care physician will handle any further medical issues. Please note that NO REFILLS for any discharge medications will be authorized once you are discharged, as it is imperative that you return to your primary care physician (or establish a relationship with a primary care physician if you do not have one) for your aftercare needs so that they can reassess your need for medications and monitor your lab values.    On the day of Discharge:  VITAL SIGNS:  Blood pressure (!) 144/75, pulse 78, temperature 98.4 F (36.9 C), temperature source Oral, resp. rate 16, height 5\' 3"  (1.6 m), weight 65.2 kg (143 lb 12.8 oz), SpO2 93 %. PHYSICAL EXAMINATION:  GENERAL:  72 y.o.-year-old patient lying in the bed with no acute distress.  EYES: Pupils equal, round, reactive to light and accommodation. No scleral icterus. Extraocular muscles intact.  HEENT: Head atraumatic, normocephalic. Oropharynx and nasopharynx clear.  NECK:  Supple, no jugular venous distention.  No thyroid enlargement, no tenderness.  LUNGS: Normal breath sounds bilaterally, no wheezing, rales,rhonchi or crepitation. No use of accessory muscles of respiration.  CARDIOVASCULAR: S1, S2 normal. No murmurs, rubs, or gallops.  ABDOMEN: Soft, non-tender, non-distended. Bowel sounds present. No organomegaly or mass.  EXTREMITIES: No  pedal edema, cyanosis, or clubbing.  NEUROLOGIC: Cranial nerves II through XII are intact. Muscle strength 5/5 in all extremities. Sensation intact. Gait not checked.  PSYCHIATRIC: The patient is alert and oriented x 3.  SKIN: No obvious rash, lesion, or ulcer.  DATA REVIEW:   CBC Recent Labs  Lab 04/20/17 0357  WBC 9.7  HGB 11.2*  HCT 34.2*  PLT 146*    Chemistries  Recent Labs  Lab 04/18/17 1706  04/20/17 0357  NA 133*   < > 138  K 4.2   < > 4.2  CL 103   < > 112*  CO2 22   < > 21*  GLUCOSE 111*   < > 109*  BUN 48*   < > 39*  CREATININE 2.59*   < > 2.02*  CALCIUM 8.3*   < > 8.3*  AST 66*  --   --   ALT 48  --   --   ALKPHOS 236*  --   --   BILITOT 0.6  --   --    < > = values in this interval not displayed.     Follow-up Information    [facility]. Schedule an appointment as soon as possible for a visit in 1 week(s).   Specialty:  Primary Care Contact information: [address] [place] [id number] [phone]          Management plans discussed with the patient, family and they are in agreement.  CODE STATUS: Prior   TOTAL TIME TAKING CARE OF THIS PATIENT: 45 minutes.    Delfino LovettVipul Jandiel Magallanes M.D on 04/23/2017 at 6:52 PM  Between 7am to 6pm - Pager - [phone]  After 6pm go to [email or web address] - Social research officer, governmentpassword [email or web address]  [facility] [place] [facility]  Office  [phone]  CC: Primary care physician; Patient, No Pcp Per   Note: This dictation was prepared with Dragon dictation along with smaller phrase technology. Any transcriptional errors that result from this process are unintentional.

## 2019-08-23 IMAGING — CR DG CHEST 2V
2 series · 2 of 2 positions shown · non-contrast
Comparison: 06/02/2013

CLINICAL DATA: Productive cough, fever, and chills.

EXAM:
CHEST  2 VIEW

[chest lat]
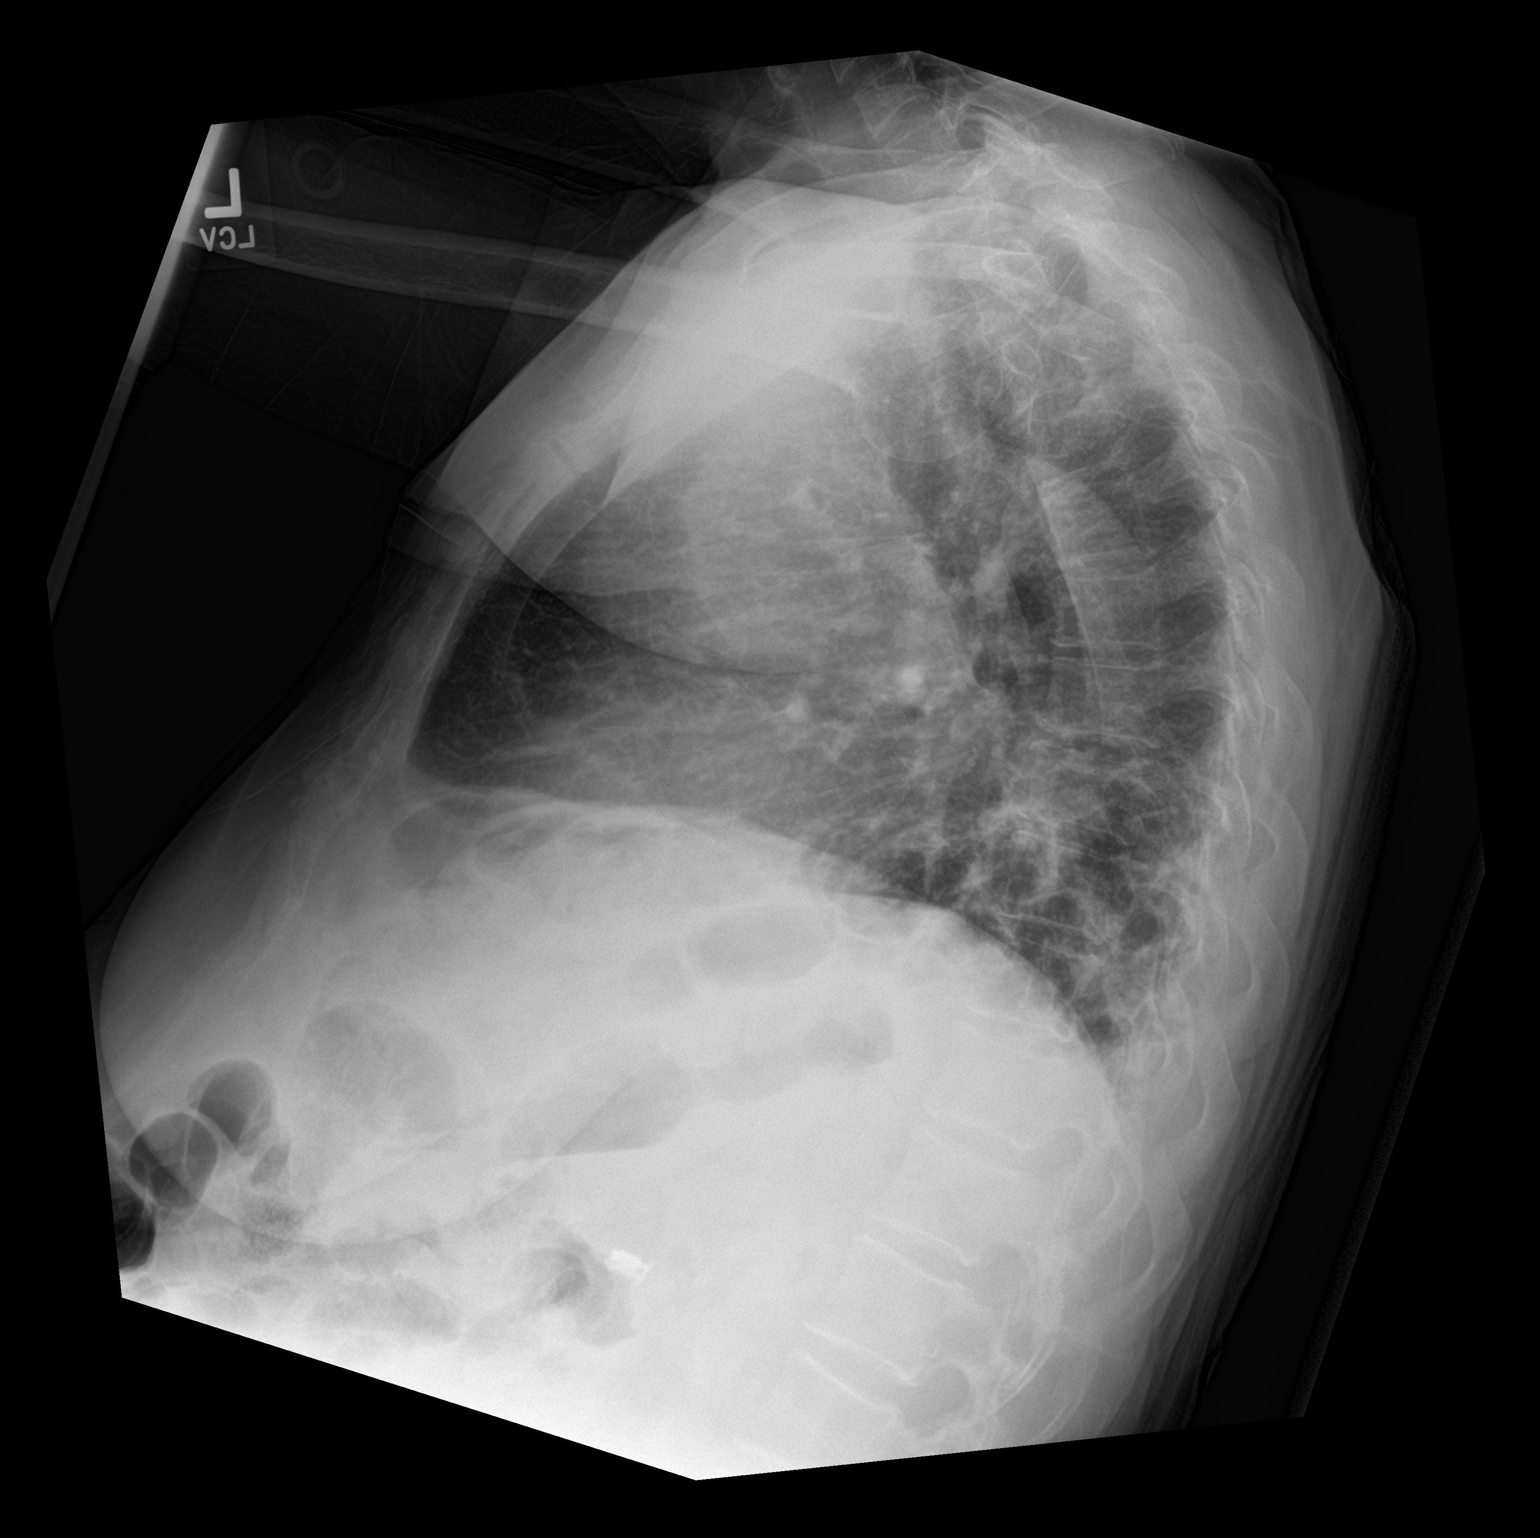

[chest ap]
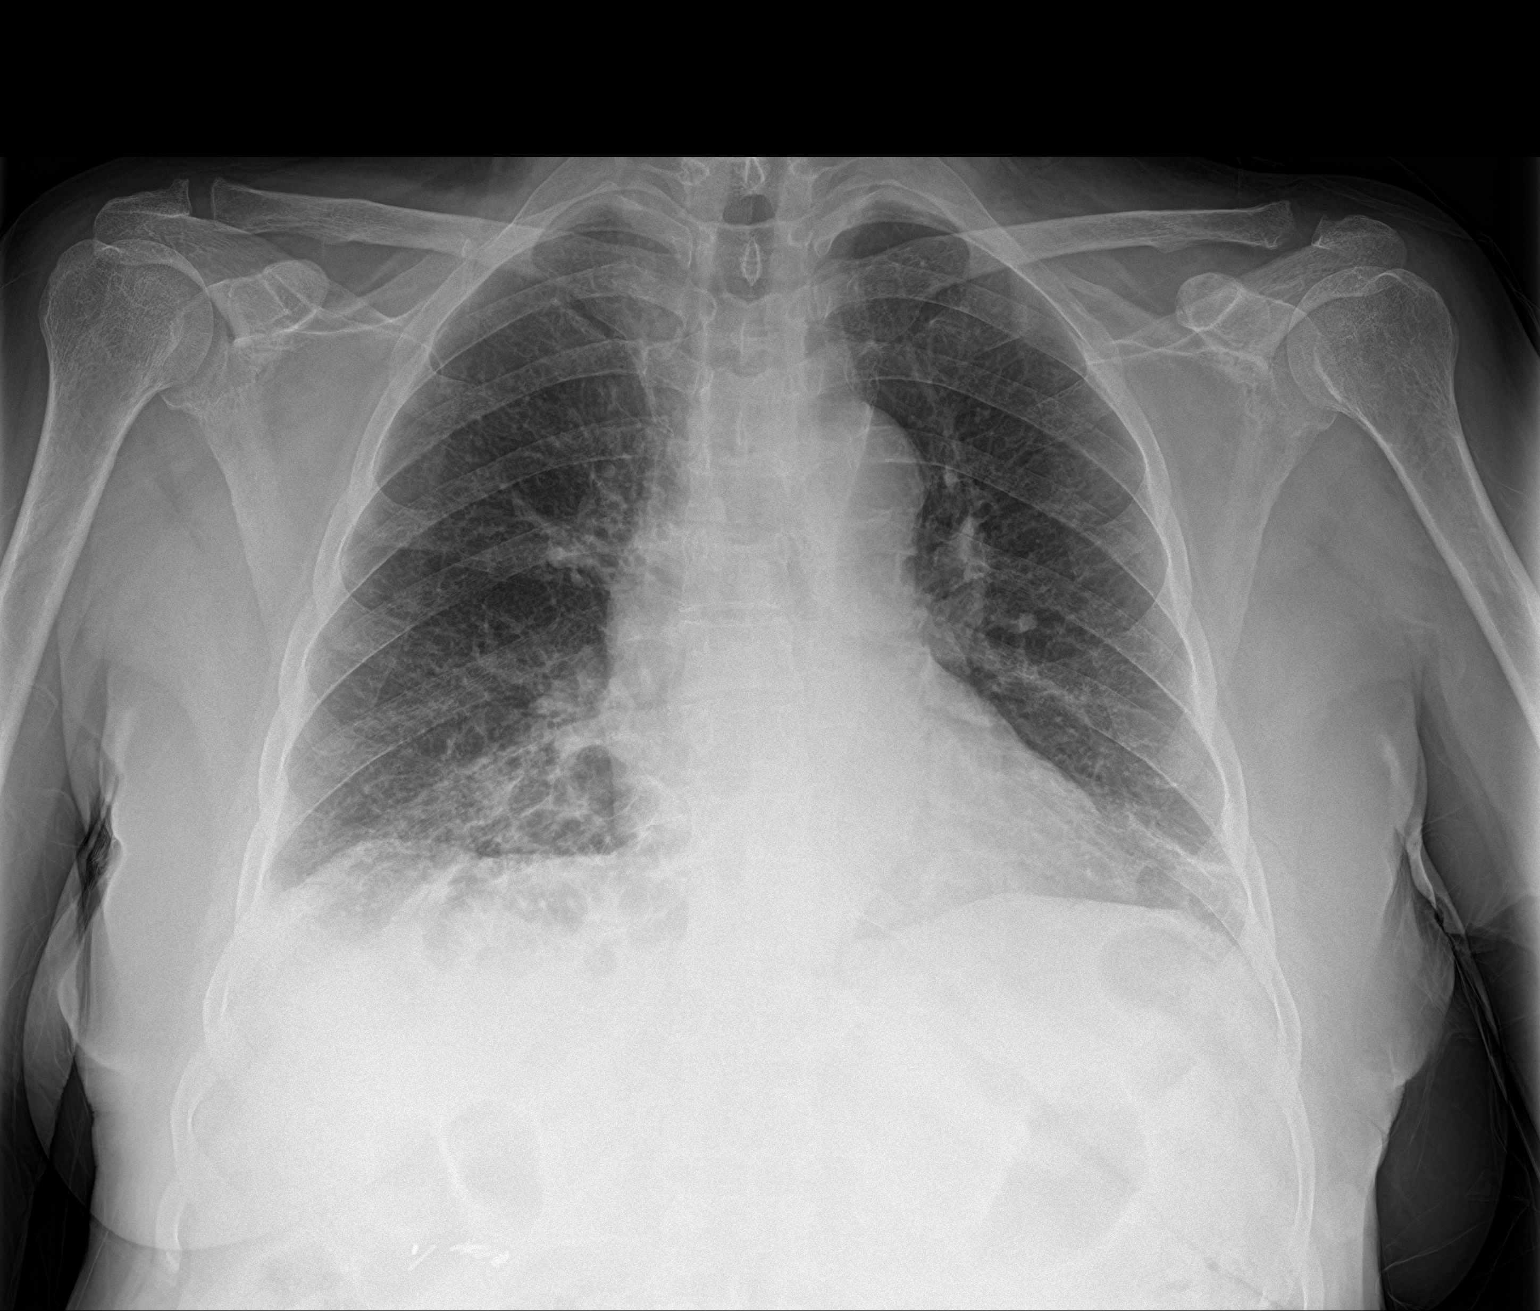

[2 of 2 positions shown; findings below may reference images not displayed]

FINDINGS: The heart size and mediastinal contours are within normal limits.
Background chronic interstitial lung disease and bibasilar scarring
noted. Increased superimposed airspace opacity is seen in the right
lower lobe, suspicious for pneumonia. No evidence of pleural
effusion.
IMPRESSION: Increased right lower lobe airspace opacity, suspicious for
pneumonia. Underlying chronic interstitial lung disease and
bibasilar scarring.

## 2022-09-29 ENCOUNTER — Emergency Department: Payer: Self-pay

## 2022-09-29 ENCOUNTER — Other Ambulatory Visit: Payer: Self-pay

## 2022-09-29 ENCOUNTER — Inpatient Hospital Stay
Admission: EM | Admit: 2022-09-29 | Discharge: 2022-10-03 | DRG: 177 | Disposition: A | Discharge status: Home / Self Care | Payer: Self-pay | Attending: Family Medicine | Admitting: Family Medicine

## 2022-09-29 ENCOUNTER — Encounter: Payer: Self-pay | Admitting: Emergency Medicine

## 2022-09-29 DIAGNOSIS — E875 Hyperkalemia: Secondary | ICD-10-CM | POA: Diagnosis present

## 2022-09-29 DIAGNOSIS — Z9049 Acquired absence of other specified parts of digestive tract: Secondary | ICD-10-CM

## 2022-09-29 DIAGNOSIS — N2581 Secondary hyperparathyroidism of renal origin: Secondary | ICD-10-CM | POA: Diagnosis present

## 2022-09-29 DIAGNOSIS — Z91158 Patient's noncompliance with renal dialysis for other reason: Secondary | ICD-10-CM

## 2022-09-29 DIAGNOSIS — D631 Anemia in chronic kidney disease: Secondary | ICD-10-CM | POA: Diagnosis present

## 2022-09-29 DIAGNOSIS — J90 Pleural effusion, not elsewhere classified: Secondary | ICD-10-CM

## 2022-09-29 DIAGNOSIS — R7303 Prediabetes: Secondary | ICD-10-CM | POA: Diagnosis present

## 2022-09-29 DIAGNOSIS — Z8616 Personal history of COVID-19: Secondary | ICD-10-CM

## 2022-09-29 DIAGNOSIS — U071 COVID-19: Principal | ICD-10-CM

## 2022-09-29 DIAGNOSIS — I10 Essential (primary) hypertension: Secondary | ICD-10-CM | POA: Insufficient documentation

## 2022-09-29 DIAGNOSIS — N186 End stage renal disease: Secondary | ICD-10-CM | POA: Insufficient documentation

## 2022-09-29 DIAGNOSIS — J189 Pneumonia, unspecified organism: Secondary | ICD-10-CM | POA: Diagnosis present

## 2022-09-29 DIAGNOSIS — Z992 Dependence on renal dialysis: Secondary | ICD-10-CM

## 2022-09-29 DIAGNOSIS — E785 Hyperlipidemia, unspecified: Secondary | ICD-10-CM | POA: Insufficient documentation

## 2022-09-29 DIAGNOSIS — J9601 Acute respiratory failure with hypoxia: Secondary | ICD-10-CM | POA: Diagnosis present

## 2022-09-29 DIAGNOSIS — E8770 Fluid overload, unspecified: Secondary | ICD-10-CM | POA: Diagnosis present

## 2022-09-29 DIAGNOSIS — Z86711 Personal history of pulmonary embolism: Secondary | ICD-10-CM

## 2022-09-29 DIAGNOSIS — J181 Lobar pneumonia, unspecified organism: Secondary | ICD-10-CM | POA: Diagnosis present

## 2022-09-29 DIAGNOSIS — I120 Hypertensive chronic kidney disease with stage 5 chronic kidney disease or end stage renal disease: Secondary | ICD-10-CM | POA: Diagnosis present

## 2022-09-29 DIAGNOSIS — J1282 Pneumonia due to coronavirus disease 2019: Secondary | ICD-10-CM | POA: Diagnosis present

## 2022-09-29 LAB — CBC
HCT: 33.9 % — ABNORMAL LOW (ref 36.0–46.0)
Hemoglobin: 10.7 g/dL — ABNORMAL LOW (ref 12.0–15.0)
MCH: 27.9 pg (ref 26.0–34.0)
MCHC: 31.6 g/dL (ref 30.0–36.0)
MCV: 88.3 fL (ref 80.0–100.0)
Platelets: 166 10*3/uL (ref 150–400)
RBC: 3.84 MIL/uL — ABNORMAL LOW (ref 3.87–5.11)
RDW: 16.7 % — ABNORMAL HIGH (ref 11.5–15.5)
WBC: 7 10*3/uL (ref 4.0–10.5)
nRBC: 0 % (ref 0.0–0.2)

## 2022-09-29 LAB — BASIC METABOLIC PANEL
Anion gap: 11 (ref 5–15)
BUN: 28 mg/dL — ABNORMAL HIGH (ref 8–23)
CO2: 25 mmol/L (ref 22–32)
Calcium: 8.8 mg/dL — ABNORMAL LOW (ref 8.9–10.3)
Chloride: 99 mmol/L (ref 98–111)
Creatinine, Ser: 7.51 mg/dL — ABNORMAL HIGH (ref 0.44–1.00)
GFR, Estimated: 5 mL/min — ABNORMAL LOW (ref >60)
Glucose, Bld: 93 mg/dL (ref 70–99)
Potassium: 5.2 mmol/L — ABNORMAL HIGH (ref 3.5–5.1)
Sodium: 135 mmol/L (ref 135–145)

## 2022-09-29 LAB — BRAIN NATRIURETIC PEPTIDE: B Natriuretic Peptide: 4176.9 pg/mL — ABNORMAL HIGH (ref 0.0–100.0)

## 2022-09-29 LAB — HEPATIC FUNCTION PANEL
ALT: 10 U/L (ref 0–44)
AST: 22 U/L (ref 15–41)
Albumin: 2.6 g/dL — ABNORMAL LOW (ref 3.5–5.0)
Alkaline Phosphatase: 100 U/L (ref 38–126)
Bilirubin, Direct: 0.2 mg/dL (ref 0.0–0.2)
Indirect Bilirubin: 0.5 mg/dL (ref 0.3–0.9)
Total Bilirubin: 0.7 mg/dL (ref 0.3–1.2)
Total Protein: 5.9 g/dL — ABNORMAL LOW (ref 6.5–8.1)

## 2022-09-29 LAB — BLOOD GAS, ARTERIAL
Acid-Base Excess: 0.8 mmol/L (ref 0.0–2.0)
Bicarbonate: 27.1 mmol/L (ref 20.0–28.0)
O2 Content: 4 L/min
O2 Saturation: 86.6 %
Patient temperature: 37
pCO2 arterial: 49 mmHg — ABNORMAL HIGH (ref 32–48)
pH, Arterial: 7.35 (ref 7.35–7.45)
pO2, Arterial: 58 mmHg — ABNORMAL LOW (ref 83–108)

## 2022-09-29 LAB — RESP PANEL BY RT-PCR (RSV, FLU A&B, COVID)  RVPGX2
Influenza A by PCR: NEGATIVE
Influenza B by PCR: NEGATIVE
Resp Syncytial Virus by PCR: NEGATIVE
SARS Coronavirus 2 by RT PCR: POSITIVE — AB

## 2022-09-29 LAB — PROCALCITONIN: Procalcitonin: 0.42 ng/mL

## 2022-09-29 LAB — GLUCOSE, CAPILLARY: Glucose-Capillary: 98 mg/dL (ref 70–99)

## 2022-09-29 LAB — LIPASE, BLOOD: Lipase: 23 U/L (ref 11–51)

## 2022-09-29 LAB — STREP PNEUMONIAE URINARY ANTIGEN: Strep Pneumo Urinary Antigen: NEGATIVE

## 2022-09-29 LAB — TROPONIN I (HIGH SENSITIVITY)
Troponin I (High Sensitivity): 10 ng/L (ref <18)
Troponin I (High Sensitivity): 11 ng/L (ref <18)

## 2022-09-29 LAB — MRSA NEXT GEN BY PCR, NASAL: MRSA by PCR Next Gen: NOT DETECTED

## 2022-09-29 LAB — FERRITIN: Ferritin: 235 ng/mL (ref 11–307)

## 2022-09-29 MED ORDER — IPRATROPIUM-ALBUTEROL 0.5-2.5 (3) MG/3ML IN SOLN
3.0000 mL | RESPIRATORY_TRACT | Status: DC
  Administered 2022-09-29 (×3): 3 mL via RESPIRATORY_TRACT
  Filled 2022-09-29 (×3): qty 3

## 2022-09-29 MED ORDER — METHYLPREDNISOLONE SODIUM SUCC 40 MG IJ SOLR: 40.0000 mg | Freq: Two times a day (BID) | INTRAMUSCULAR | Status: DC

## 2022-09-29 MED ORDER — BUDESONIDE 0.5 MG/2ML IN SUSP
0.5000 mg | Freq: Two times a day (BID) | RESPIRATORY_TRACT | Status: DC
  Administered 2022-09-29 – 2022-09-30 (×3): 0.5 mg via RESPIRATORY_TRACT
  Filled 2022-09-29 (×3): qty 2

## 2022-09-29 MED ORDER — IOHEXOL 350 MG/ML SOLN
75.0000 mL | Freq: Once | INTRAVENOUS | Status: AC | PRN
  Administered 2022-09-29: 75 mL via INTRAVENOUS

## 2022-09-29 MED ORDER — AMLODIPINE BESYLATE 5 MG PO TABS
5.0000 mg | ORAL_TABLET | Freq: Every day | ORAL | Status: DC
  Administered 2022-09-29 – 2022-10-03 (×5): 5 mg via ORAL
  Filled 2022-09-29 (×5): qty 1

## 2022-09-29 MED ORDER — PENTAFLUOROPROP-TETRAFLUOROETH EX AERO
1.0000 | INHALATION_SPRAY | CUTANEOUS | Status: DC | PRN
  Administered 2022-10-03: 1 via TOPICAL
  Filled 2022-09-29: qty 30

## 2022-09-29 MED ORDER — SODIUM CHLORIDE 0.9 % IV SOLN
100.0000 mg | Freq: Every day | INTRAVENOUS | Status: AC
  Administered 2022-09-30 – 2022-10-01 (×2): 100 mg via INTRAVENOUS
  Filled 2022-09-29 (×2): qty 20

## 2022-09-29 MED ORDER — FERROUS SULFATE 325 (65 FE) MG PO TABS
325.0000 mg | ORAL_TABLET | Freq: Every day | ORAL | Status: DC
  Administered 2022-09-30 – 2022-10-03 (×4): 325 mg via ORAL
  Filled 2022-09-29 (×5): qty 1

## 2022-09-29 MED ORDER — IPRATROPIUM-ALBUTEROL 0.5-2.5 (3) MG/3ML IN SOLN
3.0000 mL | Freq: Four times a day (QID) | RESPIRATORY_TRACT | Status: DC
  Administered 2022-09-30: 3 mL via RESPIRATORY_TRACT
  Filled 2022-09-29: qty 3

## 2022-09-29 MED ORDER — LINEZOLID 600 MG/300ML IV SOLN
600.0000 mg | Freq: Two times a day (BID) | INTRAVENOUS | Status: DC
  Administered 2022-09-29 – 2022-09-30 (×3): 600 mg via INTRAVENOUS
  Filled 2022-09-29 (×4): qty 300

## 2022-09-29 MED ORDER — LIDOCAINE HCL (PF) 1 % IJ SOLN: 5.0000 mL | INTRAMUSCULAR | Status: DC | PRN

## 2022-09-29 MED ORDER — SODIUM CHLORIDE 0.9 % IV SOLN
200.0000 mg | Freq: Once | INTRAVENOUS | Status: AC
  Administered 2022-09-29: 200 mg via INTRAVENOUS
  Filled 2022-09-29: qty 40

## 2022-09-29 MED ORDER — VITAMIN C 500 MG PO TABS
500.0000 mg | ORAL_TABLET | Freq: Every day | ORAL | Status: DC
  Administered 2022-09-29 – 2022-10-03 (×5): 500 mg via ORAL
  Filled 2022-09-29 (×5): qty 1

## 2022-09-29 MED ORDER — SODIUM CHLORIDE 0.9 % IV SOLN
1.0000 g | Freq: Once | INTRAVENOUS | Status: AC
  Administered 2022-09-29: 1 g via INTRAVENOUS
  Filled 2022-09-29: qty 10

## 2022-09-29 MED ORDER — PRAVASTATIN SODIUM 20 MG PO TABS
20.0000 mg | ORAL_TABLET | Freq: Every day | ORAL | Status: DC
  Administered 2022-09-29 – 2022-10-02 (×4): 20 mg via ORAL
  Filled 2022-09-29 (×4): qty 1

## 2022-09-29 MED ORDER — ALTEPLASE 2 MG IJ SOLR: 2.0000 mg | Freq: Once | INTRAMUSCULAR | Status: DC | PRN

## 2022-09-29 MED ORDER — CHLORHEXIDINE GLUCONATE CLOTH 2 % EX PADS
6.0000 | MEDICATED_PAD | Freq: Every day | CUTANEOUS | Status: DC
  Administered 2022-09-29 – 2022-10-03 (×5): 6 via TOPICAL
  Filled 2022-09-29: qty 6

## 2022-09-29 MED ORDER — DEXAMETHASONE SODIUM PHOSPHATE 10 MG/ML IJ SOLN
6.0000 mg | INTRAMUSCULAR | Status: DC
  Administered 2022-09-29: 6 mg via INTRAVENOUS
  Filled 2022-09-29: qty 1

## 2022-09-29 MED ORDER — ANTICOAGULANT SODIUM CITRATE 4% (200MG/5ML) IV SOLN: 5.0000 mL | Status: DC | PRN

## 2022-09-29 MED ORDER — SODIUM CHLORIDE 0.9 % IV SOLN
500.0000 mg | Freq: Once | INTRAVENOUS | Status: AC
  Administered 2022-09-29: 500 mg via INTRAVENOUS
  Filled 2022-09-29: qty 5

## 2022-09-29 MED ORDER — ZINC SULFATE 220 (50 ZN) MG PO CAPS
220.0000 mg | ORAL_CAPSULE | Freq: Every day | ORAL | Status: DC
  Administered 2022-09-29 – 2022-10-03 (×4): 220 mg via ORAL
  Filled 2022-09-29 (×5): qty 1

## 2022-09-29 MED ORDER — HEPARIN SODIUM (PORCINE) 1000 UNIT/ML DIALYSIS: 1000.0000 [IU] | INTRAMUSCULAR | Status: DC | PRN

## 2022-09-29 MED ORDER — LIDOCAINE-PRILOCAINE 2.5-2.5 % EX CREA: 1.0000 | TOPICAL_CREAM | CUTANEOUS | Status: DC | PRN

## 2022-09-29 MED ORDER — IPRATROPIUM-ALBUTEROL 0.5-2.5 (3) MG/3ML IN SOLN: 3.0000 mL | RESPIRATORY_TRACT | Status: DC | PRN

## 2022-09-29 MED ORDER — METHYLPREDNISOLONE SODIUM SUCC 125 MG IJ SOLR
80.0000 mg | INTRAMUSCULAR | Status: DC
  Administered 2022-09-29 – 2022-09-30 (×2): 80 mg via INTRAVENOUS
  Filled 2022-09-29 (×2): qty 2

## 2022-09-29 MED ORDER — HEPARIN SODIUM (PORCINE) 5000 UNIT/ML IJ SOLN
5000.0000 [IU] | Freq: Three times a day (TID) | INTRAMUSCULAR | Status: DC
  Administered 2022-09-29 – 2022-10-03 (×12): 5000 [IU] via SUBCUTANEOUS
  Filled 2022-09-29 (×12): qty 1

## 2022-09-29 MED ORDER — CINACALCET HCL 30 MG PO TABS
30.0000 mg | ORAL_TABLET | Freq: Every day | ORAL | Status: DC
  Administered 2022-09-30 – 2022-10-03 (×4): 30 mg via ORAL
  Filled 2022-09-29 (×5): qty 1

## 2022-09-29 NOTE — ED Provider Notes (Signed)
[facility] Provider Note    Event Date/Time   First MD Initiated Contact with Patient 09/29/22 0825     (approximate)   History   Chief Complaint Chest Pain and Abdominal Pain   HPI  Betty Silva is a 77 y.o. female with past medical history of hypertension, ESRD on HD (TTS), and PE who presents to the ED complaining of chest pain.  History is limited as patient is Spanish-speaking only, history obtained with assistance of in person interpreter.  Patient reports that she has been dealing with sharp pain in her chest and upper back since yesterday.  She denies any difficulty breathing, but has had an increase in her chronic cough.  She also complains of swelling in both of her legs.  She is due for dialysis later this morning, has not missed any previous sessions.     Physical Exam   Triage Vital Signs: ED Triage Vitals  Enc Vitals Group     BP 09/29/22 0739 (!) 154/69     Pulse Rate 09/29/22 0739 92     Resp 09/29/22 0739 18     Temp 09/29/22 0739 98.7 F (37.1 C)     Temp Source 09/29/22 0739 Oral     SpO2 09/29/22 0739 93 %     Weight 09/29/22 0736 143 lb 11.8 oz (65.2 kg)     Height 09/29/22 0736 5\' 3"  (1.6 m)     Head Circumference --      Peak Flow --      Pain Score 09/29/22 0738 10     Pain Loc --      Pain Edu? --      Excl. in GC? --     Most recent vital signs: Vitals:   09/29/22 0739  BP: (!) 154/69  Pulse: 92  Resp: 18  Temp: 98.7 F (37.1 C)  SpO2: 93%    Constitutional: Alert and oriented. Eyes: Conjunctivae are normal. Head: Atraumatic. Nose: No congestion/rhinnorhea. Mouth/Throat: Mucous membranes are moist.  Cardiovascular: Normal rate, regular rhythm. Grossly normal heart sounds.  2+ radial pulses bilaterally.  Left upper extremity AV fistula with palpable thrill. Respiratory: Tachypneic with increased respiratory effort.  No retractions. Lungs with crackles throughout bilaterally Gastrointestinal: Soft  and nontender. No distention. Musculoskeletal: No lower extremity tenderness, 1+ pitting edema to knees bilaterally. Neurologic:  Normal speech and language. No gross focal neurologic deficits are appreciated.    ED Results / Procedures / Treatments   Labs (all labs ordered are listed, but only abnormal results are displayed) Labs Reviewed  RESP PANEL BY RT-PCR (RSV, FLU A&B, COVID)  RVPGX2 - Abnormal; Notable for the following components:      Result Value   SARS Coronavirus 2 by RT PCR POSITIVE (*)    All other components within normal limits  BASIC METABOLIC PANEL - Abnormal; Notable for the following components:   Potassium 5.2 (*)    BUN 28 (*)    Creatinine, Ser 7.51 (*)    Calcium 8.8 (*)    GFR, Estimated 5 (*)    All other components within normal limits  CBC - Abnormal; Notable for the following components:   RBC 3.84 (*)    Hemoglobin 10.7 (*)    HCT 33.9 (*)    RDW 16.7 (*)    All other components within normal limits  HEPATIC FUNCTION PANEL - Abnormal; Notable for the following components:   Total Protein 5.9 (*)    Albumin 2.6 (*)  All other components within normal limits  LIPASE, BLOOD  PROCALCITONIN  TROPONIN I (HIGH SENSITIVITY)  TROPONIN I (HIGH SENSITIVITY)     EKG  ED ECG REPORT I, Chesley Noon, the attending physician, personally viewed and interpreted this ECG.   Date: 09/29/2022  EKG Time: 7:43  Rate: 88  Rhythm: normal sinus rhythm  Axis: LAD  Intervals:none  ST&T Change: None  RADIOLOGY CXR reviewed and interpreted by me with patchy bilateral infiltrates and small bilateral effusions.  PROCEDURES:  Critical Care performed: Yes, see critical care procedure note(s)  .Critical Care  Performed by: Chesley Noon, MD Authorized by: Chesley Noon, MD   Critical care provider statement:    Critical care time (minutes):  30   Critical care time was exclusive of:  Separately billable procedures and treating other patients and  teaching time   Critical care was necessary to treat or prevent imminent or life-threatening deterioration of the following conditions:  Respiratory failure   Critical care was time spent personally by me on the following activities:  Development of treatment plan with patient or surrogate, discussions with consultants, evaluation of patient's response to treatment, examination of patient, ordering and review of laboratory studies, ordering and review of radiographic studies, ordering and performing treatments and interventions, pulse oximetry, re-evaluation of patient's condition and review of old charts   I assumed direction of critical care for this patient from another provider in my specialty: no     Care discussed with: admitting provider      MEDICATIONS ORDERED IN ED: Medications  ceFEPIme (MAXIPIME) 1 g in sodium chloride 0.9 % 100 mL IVPB (has no administration in time range)  azithromycin (ZITHROMAX) 500 mg in sodium chloride 0.9 % 250 mL IVPB (has no administration in time range)  cinacalcet (SENSIPAR) tablet 30 mg (has no administration in time range)  amLODipine (NORVASC) tablet 5 mg (has no administration in time range)  ferrous sulfate tablet 325 mg (has no administration in time range)  iohexol (OMNIPAQUE) 350 MG/ML injection 75 mL (75 mLs Intravenous Contrast Given 09/29/22 0922)     IMPRESSION / MDM / ASSESSMENT AND PLAN / ED COURSE  I reviewed the triage vital signs and the nursing notes.                              77 y.o. female with past medical history of hypertension, ESRD on HD (TTS), and PE not currently on anticoagulation who presents to the ED complaining of constant sharp pain in her chest and upper back since yesterday with associated acute on chronic cough.  Patient's presentation is most consistent with acute presentation with potential threat to life or bodily function.  Differential diagnosis includes, but is not limited to, ACS, PE, dissection,  pneumonia, pneumothorax, bronchitis, COVID-19, musculoskeletal pain, pulmonary edema, electrolyte abnormality, anemia.  Patient chronically ill but nontoxic-appearing and in no acute distress.  She denies any difficulty breathing but does have increased work of breathing on exam with crackles throughout.  She appears fluid overloaded and chest x-ray shows multifocal infiltrates that could be pulmonary edema, but with her increased cough I am also concerned for pneumonia.  No findings concerning for sepsis at this time, with her history of PE not on anticoagulation will also check CTA of her chest to rule out PE.  EKG shows no evidence of arrhythmia or ischemia and troponin within normal limits, low suspicion for ACS.  Renal function consistent with  known ESRD, she does have some mild hyperkalemia with this, no significant anemia or leukocytosis noted.  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  Patient with loculated effusions and inflammatory changes concerning for pneumonia, will start on cefepime and azithromycin.  On reassessment, patient's oxygen saturations at 89% on room air, she was placed on 2 L nasal cannula with improvement.  Case discussed with hospitalist for admission, I also spoke with Dr. Cherylann Ratel to arrange for dialysis.      FINAL CLINICAL IMPRESSION(S) / ED DIAGNOSES   Final diagnoses:  Pneumonia due to infectious organism, unspecified laterality, unspecified part of lung  Pleural effusion  ESRD on hemodialysis (HCC)  COVID-19     Rx / DC Orders   ED Discharge Orders     None        Note:  This document was prepared using Dragon voice recognition software and may include unintentional dictation errors.   Chesley Noon, MD 09/29/22 1124

## 2022-09-29 NOTE — ED Triage Notes (Signed)
Family states today is pt's day for dialysis also, appt time is 1045.

## 2022-09-29 NOTE — Progress Notes (Signed)
1915 patient alert to self family at bedside able to help translate to patient alert to self baseline per family. No pain noted. HD present to start patients treatment.  09/30/22 0000 patient tolerated HD well ADL care done after 0530 CHG and bath given patient tolerated well able to move self well in bed

## 2022-09-29 NOTE — ED Triage Notes (Signed)
Pt here with cp that started yesterday but has gotten worse over time. Pt states her entire chest and back is hurting and the pain is constant. Pt denies NVD.

## 2022-09-29 NOTE — ED Notes (Signed)
See triage notes. Patient's family stated the patient began complaining of chest and all over body pain yesterday. Patient woke this morning with it being worse. Family stated the patient always has a cough.

## 2022-09-29 NOTE — Assessment & Plan Note (Signed)
COVID-19 positive in the setting of acute respiratory failure with hypoxia and multiloculated basilar pneumonia IV Decadron Remdesivir Trend inflammatory markers As needed DuoNebs Follow closely

## 2022-09-29 NOTE — Assessment & Plan Note (Signed)
Continue statin. 

## 2022-09-29 NOTE — ED Notes (Signed)
Ambulated to b/r with family    O2 sats dropped to 89-90%  resps remain labored

## 2022-09-29 NOTE — Assessment & Plan Note (Signed)
Baseline ESRD on hemodialysis TTS Missed hemodialysis today given respiratory failure In conversation with Dr. Larinda Buttery, nephrology has already been consulted Follow

## 2022-09-29 NOTE — Assessment & Plan Note (Signed)
Decompensated respiratory status now requiring 2 L nasal cannula with noted cough, shortness of breath, malaise of multiple days  CTA chest with noted right lower lobe multiloculated pneumonia Patient also COVID-positive Will place on IV Decadron and remdesivir for COVID coverage Rocephin azithromycin for pneumonia coverage Case also discussed with pulmonology given multiloculated nature of pneumonia on CT of the chest They will formally evaluate  Follow-up formal recommendations

## 2022-09-29 NOTE — Progress Notes (Signed)
Patient alert to self. Attempted to use tele-interpreter at bedside for admission, patient confused and having difficult time understanding. Patient seems to understand some  but is very confused and does not follow conversation. Per family, patient does not understand "spanish/spanish". Per family patient is always confused at baseline, patient alternates living with family members monthly. Called interpreter to bedside, patient speaks [place] per interpreter.  Patient pressures stable, on 2L [place], resting comfortably in bed. Family at bedside. Will continue to monitor.

## 2022-09-29 NOTE — Progress Notes (Signed)
Received consult for this patient dialysis for fluid overload and mildly elevated potassium. Patient found to be Covid positive and must be completed as a bedside. Orders placed and patient will receive treatment this evening. Will provide formal consult tomorrow.

## 2022-09-29 NOTE — Progress Notes (Signed)
PHARMACIST - PHYSICIAN COMMUNICATION   CONCERNING: Methylprednisolone IV    Current order: Methylprednisolone IV 40 mg q12h     DESCRIPTION: Per [facility] Protocol:   IV methylprednisolone will be converted to either a q12h or q24h frequency with the same total daily dose (TDD).  Ordered Dose: 1 to 125 mg TDD; convert to: TDD q24h.  Ordered Dose: 126 to 250 mg TDD; convert to: TDD div q12h.  Ordered Dose: >250 mg TDD; DAW.  Order has been adjusted to: Methylprednisolone IV 80 mg q24h   Gardner Candle , PharmD, BCPS Clinical Pharmacist  09/29/2022 1:49 PM

## 2022-09-29 NOTE — Consult Note (Signed)
NAME:  Betty Silva, MRN:  [id number], DOB:  07-04-1945, LOS: 0 ADMISSION DATE:  09/29/2022, CONSULTATION DATE:  09/29/2022 REFERRING MD:  Dr. Alvester Morin, CHIEF COMPLAINT:  Chest & Abdominal pain, cough   Brief Pt Description / Synopsis:  77 y.o. female with PMHx of ESRD on HD (TTS), admitted with Acute Hypoxic Respiratory Failure in the setting of COVID-19 infection, suspected superimposed necrotizing bacterial pneumonia, concern for bilateral loculated pleural effusions, & volume overload.  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  Patient is Spanish-speaking only, therefore interview performed via remote interpreter with daughter and granddaughter at bedside.  Per family she has been in her normal state of health until yesterday afternoon when she developed acute onset of chest/abdominal pain with coughing, and fever.  She denies any sputum production, hemoptysis, chills, nausea, vomiting, diarrhea, dysuria, edema, sick contacts.  She also reports she has been compliant with her outpatient hemodialysis sessions, of which her last session was performed last Saturday and she is due for her Tuesday session today.  ED Course: Initial Vital Signs: Temperature 98.7 F orally, pulse 92, respiratory 18, blood pressure 154/69, SpO2 89% on room air Significant Labs: Potassium 5.2, BUN 28, creatinine 7.5, BNP 4176, high-sensitivity troponin negative x 2, WBC 7.0, procalcitonin 0.42, hemoglobin 10.7 COVID-19 PCR is positive Imaging Chest X-ray>>IMPRESSION: 1. Increased patchy opacities of the bilateral lower lobes and right upper lobe. Recommend follow-up PA and lateral chest radiograph in 6-8 weeks to ensure resolution. 2. New small  left-greater-than-right pleural effusions. CT Angio Chest>>IMPRESSION: 1. Ill-defined filling defects in the right lower lobe segmental/subsegmental pulmonary arteries, likely due to slow flow given associated chronic lung disease of the right lower lobe. No evidence acute pulmonary embolus. 2. 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 3. Loculated trace left-greater-than-right pleural effusions with right fissural fluid. 4. Multiloculated cystic lesion of the right lower lobe with slightly increased wall thickening and new air-fluid level, concerning for superimposed infection. 5. Mild bilateral bronchial wall thickening, findings can be seen in the setting of bronchitis. 6.  Aortic Atherosclerosis (ICD10-I70.0). Medications Administered: Azithromycin, Cefepime  TRH asked to admit for further workup and treatment.  PCCM asked to see for Pulmonary Consultation and for high risk of decompensation.    Please see "Significant Hospital Events" section below for full detailed hospital course.  Pertinent  Medical History   Past Medical History:  Diagnosis Date   Back pain .   CKD (chronic kidney disease)    History of pulmonary embolism    Prediabetes      Micro Data:  7/9: COVID-19 PCR>> positive 7/9: Strep pneumo urinary antigen>> 7/9: Legionella urine antigen>> 7/9: Mycoplasma pneumoniae antibody>> 7/9: MRSA PCR>> 7/9: Sputum>>  Antimicrobials:   Anti-infectives (From admission, onward)    Start     Dose/Rate Route Frequency Ordered Stop   09/30/22 1000  remdesivir 100 mg in sodium chloride 0.9 % 100 mL IVPB       See Hyperspace for full Linked Orders Report.   100 mg 200 mL/hr over 30 Minutes Intravenous Daily 09/29/22 1222 10/02/22 0959   09/29/22 1500  remdesivir 200 mg in sodium chloride 0.9% 250 mL IVPB       See Hyperspace for full  Linked Orders Report.   200 mg 580 mL/hr over 30  Minutes Intravenous Once 09/29/22 1222     09/29/22 1330  linezolid (ZYVOX) IVPB 600 mg        600 mg 300 mL/hr over 60 Minutes Intravenous Every 12 hours 09/29/22 1253     09/29/22 1115  ceFEPIme (MAXIPIME) 1 g in sodium chloride 0.9 % 100 mL IVPB        1 g 200 mL/hr over 30 Minutes Intravenous  Once 09/29/22 1104     09/29/22 1115  azithromycin (ZITHROMAX) 500 mg in sodium chloride 0.9 % 250 mL IVPB        500 mg 250 mL/hr over 60 Minutes Intravenous  Once 09/29/22 1104          Significant Hospital Events: Including procedures, antibiotic start and stop dates in addition to other pertinent events   7/9: Admitted by [facility].  PCCM consulted for pulmonary consult and due to high risk of decompensation.  Nephrology consulted, tentative plan for HD today  Interim History / Subjective:  -Patient seen and evaluated in the ED -On 2 L supplemental O2, however noted to have mild respiratory distress and accessory muscle use with audible expiratory wheezing -Will changed Decadron to Solumedrol 40 mg BID -High risk for further decompensation and need for intubation -Concern for possible necrotizing pneumonia, will broaden antibiotics to include linezolid -Nephrology has been contacted, plan for HD today  Objective   Blood pressure (!) 150/70, pulse 90, temperature 98 F (36.7 C), resp. rate 18, height 5\' 3"  (1.6 m), weight 65.2 kg, SpO2 96 %.       No intake or output data in the 24 hours ending 09/29/22 1242 Filed Weights   09/29/22 0736  Weight: 65.2 kg    Examination: General: Acute on chronically ill-appearing female, laying in bed, on 2 L nasal cannula, with mild respiratory distress HENT: Atraumatic, normocephalic, neck supple, no JVD Lungs: Coarse breath sounds throughout with audible expiratory wheezing, increased work of breathing and accessory muscle use Cardiovascular: Regular rate and rhythm, S1-S2, no murmurs, rubs, gallops Abdomen: Soft, nontender, nondistended, no  guarding rebound tenderness, bowel sounds positive on 4 Extremities: Normal bulk and tone, no deformities, trace edema bilateral lower extremities Neuro: Awake and alert, oriented x 3, moves all extremities to commands, no focal deficits GU: Deferred, patient receives HD  Resolved Hospital Problem list     Assessment & Plan:   Acute Hypoxic Respiratory Failure in the setting of COVID-19 infection, suspected superimposed necrotizing bacterial pneumonia, concern for bilateral loculated pleural effusions, & volume overload CT Angio Chest on presentation negative for PE, loculated trace pleural effusions, concerning for superimposed infection, mild bilateral bronchial wall thickening concerning for bronchitis -Supplemental O2 as needed to maintain O2 sats >88% -BiPAP, if needed -High risk for intubation -Follow intermittent Chest X-ray & ABG as needed -Bronchodilators & Pulmicort nebs -IV Steroids (Solumedrol 40 mg BID) -ABX as above -Volume removal with HD -Pulmonary toilet as able -Will have IR evaluate to see if pigtail catheter would be a viable option  #COVID-19 Infection #Concern for Superimposed Necrotizing bacterial pneumonia -Monitor fever curve -Trend WBC's & Procalcitonin -Trend inflammatory markers -Follow cultures as above -Continue empiric Azithromycin, Cefepime, Linezolid, and Remdesivir pending cultures & sensitivities  #ESRD on HD (TTS) #Mild Hyperkalemia -Monitor I&O's / urinary output -Follow BMP -Ensure adequate renal perfusion -Avoid nephrotoxic agents as able -Replace electrolytes as indicated -Nephrology consulted, appreciate input ~ HD as per Nephrology    Patient  is critically ill, high risk for further decompensation necessitating intubation and mechanical ventilation.  Prognosis is guarded.  Best Practice (right click and "Reselect all SmartList Selections" daily)   Diet/type: NPO until respiratory status improved DVT prophylaxis: prophylactic  heparin  GI prophylaxis: N/A Lines: N/A Foley:  N/A Code Status:  full code Last date of multidisciplinary goals of care discussion [N/A]  7/9: Patient, her daughter and granddaughter updated at bedside utilizing remote interpreter  Labs   CBC: Recent Labs  Lab 09/29/22 0738  WBC 7.0  HGB 10.7*  HCT 33.9*  MCV 88.3  PLT 166    Basic Metabolic Panel: Recent Labs  Lab 09/29/22 0738  NA 135  K 5.2*  CL 99  CO2 25  GLUCOSE 93  BUN 28*  CREATININE 7.51*  CALCIUM 8.8*   GFR: Estimated Creatinine Clearance: 5.9 mL/min (A) (by C-G formula based on SCr of 7.51 mg/dL (H)). Recent Labs  Lab 09/29/22 0738 09/29/22 0940  PROCALCITON  --  0.42  WBC 7.0  --     Liver Function Tests: Recent Labs  Lab 09/29/22 0738  AST 22  ALT 10  ALKPHOS 100  BILITOT 0.7  PROT 5.9*  ALBUMIN 2.6*   Recent Labs  Lab 09/29/22 0738  LIPASE 23   No results for input(s): "AMMONIA" in the last 168 hours.  ABG No results found for: "PHART", "PCO2ART", "PO2ART", "HCO3", "TCO2", "ACIDBASEDEF", "O2SAT"   Coagulation Profile: No results for input(s): "INR", "PROTIME" in the last 168 hours.  Cardiac Enzymes: No results for input(s): "CKTOTAL", "CKMB", "CKMBINDEX", "TROPONINI" in the last 168 hours.  HbA1C: No results found for: "HGBA1C"  CBG: No results for input(s): "GLUCAP" in the last 168 hours.  Review of Systems:   Positives in BOLD: Gen: Denies fever, chills, weight change, fatigue, night sweats HEENT: Denies blurred vision, double vision, hearing loss, tinnitus, sinus congestion, rhinorrhea, sore throat, neck stiffness, dysphagia PULM: Denies shortness of breath, cough, sputum production, hemoptysis, wheezing CV: Denies chest pain, edema, orthopnea, paroxysmal nocturnal dyspnea, palpitations GI: Denies abdominal pain, nausea, vomiting, diarrhea, hematochezia, melena, constipation, change in bowel habits GU: Denies dysuria, hematuria, polyuria, oliguria, urethral  discharge Endocrine: Denies hot or cold intolerance, polyuria, polyphagia or appetite change Derm: Denies rash, dry skin, scaling or peeling skin change Heme: Denies easy bruising, bleeding, bleeding gums Neuro: Denies headache, numbness, weakness, slurred speech, loss of memory or consciousness   Past Medical History:  She,  has a past medical history of Back pain (.), CKD (chronic kidney disease), History of pulmonary embolism, and Prediabetes.   Surgical History:   Past Surgical History:  Procedure Laterality Date   CHOLECYSTECTOMY       Social History:   reports that she has never smoked. She has never used smokeless tobacco. She reports that she does not drink alcohol and does not use drugs.   Family History:  Her family history is negative for Diabetes and Hypertension.   Allergies No Known Allergies   Home Medications  Prior to Admission medications   Not on File     Critical Care time: 50 minutes     Harlon Ditty, AGACNP-BC [facility] Prefer epic messenger for cross cover needs If after hours, please call E-link

## 2022-09-29 NOTE — Assessment & Plan Note (Signed)
BP stable Titrate home regimen 

## 2022-09-29 NOTE — Plan of Care (Signed)
  Problem: Education: Goal: Knowledge of risk factors and measures for prevention of condition will improve Outcome: Progressing   Problem: Coping: Goal: Psychosocial and spiritual needs will be supported Outcome: Progressing   Problem: Respiratory: Goal: Will maintain a patent airway Outcome: Progressing Goal: Complications related to the disease process, condition or treatment will be avoided or minimized Outcome: Progressing   Problem: Education: Goal: Knowledge of General Education information will improve Description: Including pain rating scale, medication(s)/side effects and non-pharmacologic comfort measures Outcome: Progressing   Problem: Health Behavior/Discharge Planning: Goal: Ability to manage health-related needs will improve Outcome: Progressing   Problem: Clinical Measurements: Goal: Ability to maintain clinical measurements within normal limits will improve Outcome: Progressing Goal: Will remain free from infection Outcome: Progressing Goal: Diagnostic test results will improve Outcome: Progressing Goal: Respiratory complications will improve Outcome: Progressing Goal: Cardiovascular complication will be avoided Outcome: Progressing   Problem: Activity: Goal: Risk for activity intolerance will decrease Outcome: Progressing   Problem: Nutrition: Goal: Adequate nutrition will be maintained Outcome: Progressing   Problem: Coping: Goal: Level of anxiety will decrease Outcome: Progressing   Problem: Elimination: Goal: Will not experience complications related to bowel motility Outcome: Progressing Goal: Will not experience complications related to urinary retention Outcome: Progressing   Problem: Pain Managment: Goal: General experience of comfort will improve Outcome: Progressing   Problem: Safety: Goal: Ability to remain free from injury will improve Outcome: Progressing   Problem: Education: Goal: Knowledge of disease and its progression  will improve Outcome: Progressing Goal: Individualized Educational Video(s) Outcome: Progressing   Problem: Skin Integrity: Goal: Risk for impaired skin integrity will decrease Outcome: Progressing   Problem: Fluid Volume: Goal: Compliance with measures to maintain balanced fluid volume will improve Outcome: Progressing   Problem: Health Behavior/Discharge Planning: Goal: Ability to manage health-related needs will improve Outcome: Progressing   Problem: Nutritional: Goal: Ability to make healthy dietary choices will improve Outcome: Progressing   Problem: Clinical Measurements: Goal: Complications related to the disease process, condition or treatment will be avoided or minimized Outcome: Progressing

## 2022-09-29 NOTE — Assessment & Plan Note (Addendum)
Acute respiratory failure with hypoxia and multiloculated lobar pneumonia on CT of the chest with patient being COVID-19 positive Imaging concerning for superimposed infectious process IV Rocephin azithromycin for bacterial coverage Will obtain blood and respiratory cultures Urine strep Legionella Pulmonology at the bedside Follow-up formal recommendations

## 2022-09-29 NOTE — H&P (Addendum)
History and Physical    Patient: Betty Silva ZOX:[id number] DOB: 01/29/1946 DOA: 09/29/2022 DOS: the patient was seen and examined on 09/29/2022 PCP: [facility]  Patient coming from: Home  Chief Complaint:  Chief Complaint  Patient presents with   Chest Pain   Abdominal Pain   HPI: Betty Silva is a 77 y.o. female with medical history significant of end-stage renal disease on hemodialysis Tuesday Thursday Saturday, hypertension, hyperlipidemia, remote history of PE presenting with acute respiratory failure with hypoxia, COVID-19, multiloculated pneumonia.  History primarily from his family as patient is Spanish-speaking only.  Per report, patient with increased work of breathing cough, malaise over the past 2 to 3 days.  No known sick contacts.  Mild nausea.  No reported vomiting or diarrhea.  No reported orthopnea or PND.  Baseline ESRD.  Has been compliant with home hemodialysis.  Missed dialysis today secondary to worsening respiratory symptoms.  Positive chest pain with deep breathing.  Positive worsening overall fatigue.  Of note, patient admitted to [facility] in April of this year with GNR. Bacteremia, Sepsis, RLL Aspiration vs. Pneumonia, possible HD Catheter-Associated Infection  Presented to the ER afebrile, hemodynamically stable.  Initially satting in the low 90s on room air, transition to 2 L nasal cannula to keep O2 sats greater than 96%.  White count 7, hemoglobin 10.7, platelets 166, COVID-positive, procalcitonin 0.4, creatinine 7.5, potassium 5.2.  Lipase 23.  CTA chest negative for PE but does show loculation in the right fissural fluid as well as multiloculated cystic lesion in the right lower lobe and slightly increased wall thickening and new air-fluid level concerning for superimposed infection. Review of Systems: As mentioned in the history of present illness. All other systems reviewed and are negative. Past Medical History:  Diagnosis  Date   Back pain .   CKD (chronic kidney disease)    History of pulmonary embolism    Prediabetes    Past Surgical History:  Procedure Laterality Date   CHOLECYSTECTOMY     Social History:  reports that she has never smoked. She has never used smokeless tobacco. She reports that she does not drink alcohol and does not use drugs.  No Known Allergies  Family History  Problem Relation Age of Onset   Diabetes Neg Hx    Hypertension Neg Hx     Prior to Admission medications   Not on File    Physical Exam: Vitals:   09/29/22 0736 09/29/22 0739 09/29/22 1207  BP:  (!) 154/69 (!) 150/70  Pulse:  92 90  Resp:  18 18  Temp:  98.7 F (37.1 C) 98 F (36.7 C)  TempSrc:  Oral   SpO2:  93% 96%  Weight: 65.2 kg    Height: 5\' 3"  (1.6 m)     Physical Exam Constitutional:      Appearance: She is normal weight.     Comments: Minimal to mild distress secondary to mild pain  HENT:     Head: Normocephalic.     Mouth/Throat:     Mouth: Mucous membranes are moist.  Cardiovascular:     Rate and Rhythm: Normal rate and regular rhythm.  Pulmonary:     Effort: Pulmonary effort is normal.     Breath sounds: Rales present.  Abdominal:     General: Bowel sounds are normal.  Musculoskeletal:        General: Normal range of motion.  Skin:    General: Skin is warm.  Neurological:  General: No focal deficit present.  Psychiatric:        Mood and Affect: Mood normal.     Data Reviewed:  There are no new results to review at this time. CT Angio Chest PE W/Cm &/Or Wo Cm CLINICAL DATA:  Pulmonary embolus suspected  EXAM: CT ANGIOGRAPHY CHEST WITH CONTRAST  TECHNIQUE: Multidetector CT imaging of the chest was performed using the standard protocol during bolus administration of intravenous contrast. Multiplanar CT image reconstructions and MIPs were obtained to evaluate the vascular anatomy.  RADIATION DOSE REDUCTION: This exam was performed according to the departmental  dose-optimization program which includes automated exposure control, adjustment of the mA and/or kV according to patient size and/or use of iterative reconstruction technique.  CONTRAST:  75mL OMNIPAQUE IOHEXOL 350 MG/ML SOLN  COMPARISON:  Chest CT dated November 11th 2011  FINDINGS: Cardiovascular: Ill-defined filling defects in the right lower lobe segmental/subsegmental pulmonary arteries, likely due to slow flow given associated severe chronic lung disease at that location. No evidence acute pulmonary embolus. Mild cardiomegaly. No pericardial effusion. Normal caliber thoracic aorta with moderate atherosclerotic disease. Mild coronary artery calcifications. 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 Collateral flow via chest wall, right diaphragmatic and azygous veins.  Mediastinum/Nodes: Esophagus and thyroid are unremarkable. No enlarged nodes seen in the chest.  Lungs/Pleura: Central airways are patent. Mild bilateral bronchial wall thickening. Focal multiloculated cystic lesion of the right lower lobe, with slightly increased wall thickening and new air-fluid level seen on series 5, image 93. Loculated left-greater-than-right trace bilateral pleural effusions with loculated fissural fluid of the right lung. Bibasilar atelectasis.  Upper Abdomen: No acute abnormality.  Musculoskeletal: Body wall edema. No acute or significant osseous findings.  Review of the MIP images confirms the above findings.  IMPRESSION: 1. Ill-defined filling defects in the right lower lobe segmental/subsegmental pulmonary arteries, likely due to slow flow given associated chronic lung disease of the right lower lobe. No evidence acute pulmonary embolus. 2. 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 3. Loculated trace left-greater-than-right pleural  effusions with right fissural fluid. 4. Multiloculated cystic lesion of the right lower lobe with slightly increased wall thickening and new air-fluid level, concerning for superimposed infection. 5. Mild bilateral bronchial wall thickening, findings can be seen in the setting of bronchitis. 6.  Aortic Atherosclerosis (ICD10-I70.0).  Electronically Signed   By: Allegra Lai M.D.   On: 09/29/2022 10:48 DG Chest 2 View CLINICAL DATA:  Chest pain  EXAM: CHEST - 2 VIEW  COMPARISON:  Chest x-ray dated April 18, 2017  FINDINGS: Cardiac and mediastinal contours are within normal limits. Chronic left lower lobe bronchiectasis. Increased patchy opacities of the bilateral lower lobes and right upper lobe. New small left-greater-than-right pleural effusions.  IMPRESSION: 1. Increased patchy opacities of the bilateral lower lobes and right upper lobe. Recommend follow-up PA and lateral chest radiograph in 6-8 weeks to ensure resolution. 2. New small left-greater-than-right pleural effusions.  Electronically Signed   By: Allegra Lai M.D.   On: 09/29/2022 08:24  Lab Results  Component Value Date   WBC 7.0 09/29/2022   HGB 10.7 (L) 09/29/2022   HCT 33.9 (L) 09/29/2022   MCV 88.3 09/29/2022   PLT 166 09/29/2022   Last metabolic panel Lab Results  Component Value Date   GLUCOSE 93 09/29/2022   NA 135 09/29/2022   K 5.2 (H) 09/29/2022   CL 99  09/29/2022   CO2 25 09/29/2022   BUN 28 (H) 09/29/2022   CREATININE 7.51 (H) 09/29/2022   GFRNONAA 5 (L) 09/29/2022   CALCIUM 8.8 (L) 09/29/2022   PROT 5.9 (L) 09/29/2022   ALBUMIN 2.6 (L) 09/29/2022   BILITOT 0.7 09/29/2022   ALKPHOS 100 09/29/2022   AST 22 09/29/2022   ALT 10 09/29/2022   ANIONGAP 11 09/29/2022    Assessment and Plan: * Acute respiratory failure with hypoxia (HCC) Decompensated respiratory status now requiring 2 L nasal cannula with noted cough, shortness of breath, malaise of multiple days  CTA  chest with noted right lower lobe multiloculated pneumonia Some concern this may be lingering pneumonia from April 2024 admission at [facility] Patient also COVID-positive Will place on IV Decadron and remdesivir for COVID coverage Rocephin azithromycin for pneumonia coverage Case also discussed with pulmonology given multiloculated nature of pneumonia on CT of the chest They will formally evaluate  Follow-up formal recommendations   PNA (pneumonia) Acute respiratory failure with hypoxia and multiloculated lobar pneumonia on CT of the chest with patient being COVID-19 positive Imaging concerning for superimposed infectious process IV Rocephin azithromycin for bacterial coverage Will obtain blood and respiratory cultures Urine strep Legionella Pulmonology at the bedside Follow-up formal recommendations  COVID-19 COVID-19 positive in the setting of acute respiratory failure with hypoxia and multiloculated basilar pneumonia IV Decadron Remdesivir Trend inflammatory markers As needed DuoNebs Follow closely  Hypertension BP stable Titrate home regimen  Hyperlipidemia Continue statin  ESRD (end stage renal disease) (HCC) Baseline ESRD on hemodialysis TTS Missed hemodialysis today given respiratory failure In conversation with Dr. Larinda Buttery, nephrology has already been consulted Follow    Greater than 50% was spent in counseling and coordination of care with patient Total encounter time 80 minutes or more   Advance Care Planning:   Code Status: Prior   Consults: Pulmonary- Dr. Belia Heman   Family Communication: Family at the bedside   Severity of Illness: The appropriate patient status for this patient is INPATIENT. Inpatient status is judged to be reasonable and necessary in order to provide the required intensity of service to ensure the patient's safety. The patient's presenting symptoms, physical exam findings, and initial radiographic and laboratory data in the context of their  chronic comorbidities is felt to place them at high risk for further clinical deterioration. Furthermore, it is not anticipated that the patient will be medically stable for discharge from the hospital within 2 midnights of admission.   * I certify that at the point of admission it is my clinical judgment that the patient will require inpatient hospital care spanning beyond 2 midnights from the point of admission due to high intensity of service, high risk for further deterioration and high frequency of surveillance required.*  Author: Floydene Flock, MD 09/29/2022 12:48 PM  For on call review [email or web address].

## 2022-09-29 NOTE — ED Notes (Signed)
Resp labored on arrival   Wheezing note  Family states this her regular dialysis day  Also denies any fever at home

## 2022-09-30 ENCOUNTER — Inpatient Hospital Stay: Payer: Self-pay

## 2022-09-30 LAB — CBC
HCT: 30.5 % — ABNORMAL LOW (ref 36.0–46.0)
Hemoglobin: 10 g/dL — ABNORMAL LOW (ref 12.0–15.0)
MCH: 27.9 pg (ref 26.0–34.0)
MCHC: 32.8 g/dL (ref 30.0–36.0)
MCV: 85.2 fL (ref 80.0–100.0)
Platelets: 131 10*3/uL — ABNORMAL LOW (ref 150–400)
RBC: 3.58 MIL/uL — ABNORMAL LOW (ref 3.87–5.11)
RDW: 16.5 % — ABNORMAL HIGH (ref 11.5–15.5)
WBC: 5.3 10*3/uL (ref 4.0–10.5)
nRBC: 0 % (ref 0.0–0.2)

## 2022-09-30 LAB — RENAL FUNCTION PANEL
Albumin: 2.3 g/dL — ABNORMAL LOW (ref 3.5–5.0)
Anion gap: 12 (ref 5–15)
BUN: 17 mg/dL (ref 8–23)
CO2: 26 mmol/L (ref 22–32)
Calcium: 8.6 mg/dL — ABNORMAL LOW (ref 8.9–10.3)
Chloride: 98 mmol/L (ref 98–111)
Creatinine, Ser: 4.61 mg/dL — ABNORMAL HIGH (ref 0.44–1.00)
GFR, Estimated: 9 mL/min — ABNORMAL LOW (ref >60)
Glucose, Bld: 99 mg/dL (ref 70–99)
Phosphorus: 6.6 mg/dL — ABNORMAL HIGH (ref 2.5–4.6)
Potassium: 4.2 mmol/L (ref 3.5–5.1)
Sodium: 136 mmol/L (ref 135–145)

## 2022-09-30 LAB — C-REACTIVE PROTEIN
CRP: 5.3 mg/dL — ABNORMAL HIGH (ref <1.0)
CRP: 7.4 mg/dL — ABNORMAL HIGH (ref <1.0)

## 2022-09-30 LAB — PROCALCITONIN: Procalcitonin: 0.71 ng/mL

## 2022-09-30 LAB — FERRITIN: Ferritin: 258 ng/mL (ref 11–307)

## 2022-09-30 LAB — HEPATITIS B SURFACE ANTIGEN: Hepatitis B Surface Ag: NONREACTIVE

## 2022-09-30 LAB — HEPATITIS B SURFACE ANTIBODY, QUANTITATIVE: Hep B S AB Quant (Post): 79 m[IU]/mL

## 2022-09-30 MED ORDER — AZITHROMYCIN 250 MG PO TABS
500.0000 mg | ORAL_TABLET | ORAL | Status: DC
  Administered 2022-09-30 – 2022-10-02 (×3): 500 mg via ORAL
  Filled 2022-09-30 (×3): qty 2

## 2022-09-30 MED ORDER — SODIUM CHLORIDE 0.9 % IV SOLN
1.0000 g | INTRAVENOUS | Status: DC
  Administered 2022-09-30 – 2022-10-02 (×3): 1 g via INTRAVENOUS
  Filled 2022-09-30 (×5): qty 10

## 2022-09-30 NOTE — Plan of Care (Signed)
BP (!) 138/59   Pulse 81   Temp 97.6 F (36.4 C) (Oral)   Resp 19   Ht 5\' 3"  (1.6 m)   Wt 65.2 kg   SpO2 94%   BMI 25.46 kg/m    Resp distress Resolving Continue IV abx and steroids and BD therapy  ICU needs resolving  PCCM to sign off

## 2022-09-30 NOTE — Progress Notes (Signed)
  PROGRESS NOTE    Betty Silva  ZOX:[id number] DOB: 1945-07-08 DOA: 09/29/2022 PCP: [facility]  [id number]  LOS: 1 day   Brief hospital course:   Assessment & Plan: Betty Silva is a 77 y.o. female with medical history significant of end-stage renal disease on hemodialysis Tuesday Thursday Saturday, hypertension, hyperlipidemia, remote history of PE presenting with acute respiratory failure with hypoxia, COVID-19, multiloculated pneumonia.    * Acute respiratory failure with hypoxia (HCC) Decompensated respiratory status now requiring 2 L nasal cannula with noted cough, shortness of breath, malaise of multiple days  CTA chest with noted right lower lobe multiloculated pneumonia Patient also COVID-positive --tx both  PNA (pneumonia) Loculated trace pleural effusions Imaging concerning for superimposed infectious process. --PCCM discussed with IR, currently no need for pigtail cath.     Plan: --d/c Linezolid since MRSA neg --resume abx as azithro and ceftriaxone  COVID-19 --started on IV Decadron and Remdesivir --continue both  Hypertension --cont amlodipine  Hyperlipidemia Continue statin  ESRD on HD TTS iHD per nephro   DVT prophylaxis: Heparin SQ Code Status: Full code  Family Communication: granddaughter updated at bedside today Level of care: Med-Surg Dispo:   The patient is from: home Anticipated d/c is to: home Anticipated d/c date is: 2-3 days   Subjective and Interval History:  Translation by granddaughter.  Pt said she felt fine.  Denied cough, denied dyspnea.   Objective: Vitals:   09/30/22 1200 09/30/22 1257 09/30/22 1300 09/30/22 1324  BP: 137/63  (!) 125/50 (!) 126/52  Pulse: 88 89 84 87  Resp: (!) 23 (!) 23 (!) 24 18  Temp: 98.9 F (37.2 C)   98.8 F (37.1 C)  TempSrc: Oral     SpO2: 94% 93% 92% 97%  Weight:      Height:        Intake/Output Summary (Last 24 hours) at 09/30/2022 1724 Last data  filed at 09/30/2022 1257 Gross per 24 hour  Intake 1420.44 ml  Output 2500 ml  Net -1079.56 ml   Filed Weights   09/29/22 0736  Weight: 65.2 kg    Examination:   Constitutional: NAD, AAOx3 HEENT: conjunctivae and lids normal, EOMI CV: No cyanosis.   RESP: normal respiratory effort, on RA Neuro: II - XII grossly intact.   Psych: Normal mood and affect.     Data Reviewed: I have personally reviewed labs and imaging studies  Time spent: 50 minutes  Darlin Priestly, MD [facility] If 7PM-7AM, please contact night-coverage 09/30/2022, 5:24 PM

## 2022-09-30 NOTE — Consult Note (Signed)
[facility]  CONSULT NOTE    Date: 09/30/2022                  Patient Name:  Betty Silva  MRN: [id number]  DOB: 03/07/1946  Age / Sex: 77 y.o., female         PCP: [facility]                 Service Requesting Consult: Critical care                 Reason for Consult: End stage renal disease requiring dialysis            History of Present Illness: Betty Silva is a 77 y.o.  female with past medical history of hypertension, hyperlipidemia, and end stage renal disease on hemodialysis, who was admitted to [facility] on 09/29/2022 for Pleural effusion [J90] Acute respiratory failure with hypoxia (HCC) [J96.01] ESRD on hemodialysis (HCC) [N18.6, Z99.2] Pneumonia due to infectious organism, unspecified laterality, unspecified part of lung [J18.9] COVID-19 [U07.1]  Patient currently receives outpaient dialysis treatments at [facility] on a TTS schedule. Missed treatment on Tuesday due to ED visit. Patient spanish speaking and prefers family member to interpret. She states has been experiencing progressive weakness and fatigue. Poor oral intake without nausea or vomiting. Shortness of breath with daily activities and cough present. Denies sick contacts.   Labs on ED arrival include potassium 5.2,BUN 28, creatinine 7.51 and GFR 5.BNP 4176 and troponin 18, Respiratory panel positive for covid 19. Chest xray shows bilateral patch opacities in bilateral upper and right lower lobe. CT Chest shows pleural effusions and right lower lobe infection.    Medications: Outpatient medications: Medications Prior to Admission  Medication Sig Dispense Refill Last Dose   amLODipine (NORVASC) 5 MG tablet Take 1 tablet by mouth daily.   Past Week    Current medications: Current Facility-Administered Medications  Medication Dose Route Frequency Provider Last Rate Last Admin   alteplase (CATHFLO ACTIVASE) injection 2 mg  2 mg Intracatheter Once  PRN Wendee Beavers, NP       amLODipine (NORVASC) tablet 5 mg  5 mg Oral Daily Floydene Flock, MD   5 mg at 09/30/22 4098   anticoagulant sodium citrate solution 5 mL  5 mL Intracatheter PRN Wendee Beavers, NP       ascorbic acid (VITAMIN C) tablet 500 mg  500 mg Oral Daily Harlon Ditty D, NP   500 mg at 09/30/22 0923   budesonide (PULMICORT) nebulizer solution 0.5 mg  0.5 mg Nebulization BID Harlon Ditty D, NP   0.5 mg at 09/30/22 0816   Chlorhexidine Gluconate Cloth 2 % PADS 6 each  6 each Topical Q0600 Wendee Beavers, NP   6 each at 09/30/22 0528   cinacalcet (SENSIPAR) tablet 30 mg  30 mg Oral Q breakfast Floydene Flock, MD   30 mg at 09/30/22 1191   ferrous sulfate tablet 325 mg  325 mg Oral Q breakfast Floydene Flock, MD   325 mg at 09/30/22 0923   heparin injection 1,000 Units  1,000 Units Intracatheter PRN Wendee Beavers, NP       heparin injection 5,000 Units  5,000 Units Subcutaneous Q8H Floydene Flock, MD   5,000 Units at 09/30/22 1307   ipratropium-albuterol (DUONEB) 0.5-2.5 (3) MG/3ML nebulizer solution 3 mL  3 mL Nebulization Q4H PRN Floydene Flock, MD       lidocaine (PF) (XYLOCAINE)  1 % injection 5 mL  5 mL Intradermal PRN Elizebeth Kluesner, Gery Pray, NP       lidocaine-prilocaine (EMLA) cream 1 Application  1 Application Topical PRN Wendee Beavers, NP       linezolid (ZYVOX) IVPB 600 mg  600 mg Intravenous Q12H Harlon Ditty D, NP   Stopped at 09/30/22 1022   methylPREDNISolone sodium succinate (SOLU-MEDROL) 125 mg/2 mL injection 80 mg  80 mg Intravenous Q24H Harlon Ditty D, NP   80 mg at 09/30/22 1306   pentafluoroprop-tetrafluoroeth (GEBAUERS) aerosol 1 Application  1 Application Topical PRN Wendee Beavers, NP       pravastatin (PRAVACHOL) tablet 20 mg  20 mg Oral q1800 Floydene Flock, MD   20 mg at 09/29/22 1810   remdesivir 100 mg in sodium chloride 0.9 % 100 mL IVPB  100 mg Intravenous Daily Floydene Flock, MD   Stopped at 09/30/22 1144   zinc  sulfate capsule 220 mg  220 mg Oral Daily Harlon Ditty D, NP   220 mg at 09/30/22 4098      Allergies: No Known Allergies    Past Medical History: Past Medical History:  Diagnosis Date   Back pain .   CKD (chronic kidney disease)    History of pulmonary embolism    Prediabetes      Past Surgical History: Past Surgical History:  Procedure Laterality Date   CHOLECYSTECTOMY       Family History: Family History  Problem Relation Age of Onset   Diabetes Neg Hx    Hypertension Neg Hx      Social History: Social History   Socioeconomic History   Marital status: Married    Spouse name: Not on file   Number of children: Not on file   Years of education: Not on file   Highest education level: Not on file  Occupational History   Not on file  Tobacco Use   Smoking status: Never   Smokeless tobacco: Never  Substance and Sexual Activity   Alcohol use: No   Drug use: No   Sexual activity: Not Currently  Other Topics Concern   Not on file  Social History Narrative   Not on file   Social Determinants of Health   Financial Resource Strain: Not on file  Food Insecurity: No Food Insecurity (09/30/2022)   Hunger Vital Sign    Worried About Running Out of Food in the Last Year: Never true    Ran Out of Food in the Last Year: Never true  Transportation Needs: No Transportation Needs (09/30/2022)   PRAPARE - Administrator, Civil Service (Medical): No    Lack of Transportation (Non-Medical): No  Physical Activity: Not on file  Stress: Not on file  Social Connections: Not on file  Intimate Partner Violence: Not At Risk (09/30/2022)   Humiliation, Afraid, Rape, and Kick questionnaire    Fear of Current or Ex-Partner: No    Emotionally Abused: No    Physically Abused: No    Sexually Abused: No     Review of Systems: Review of Systems  Constitutional:  Negative for chills, fever and malaise/fatigue.  HENT:  Negative for congestion, sore throat and  tinnitus.   Eyes:  Negative for blurred vision and redness.  Respiratory:  Positive for cough and shortness of breath. Negative for wheezing.   Cardiovascular:  Negative for chest pain, palpitations, claudication and leg swelling.  Gastrointestinal:  Negative for abdominal pain, blood in stool, diarrhea, nausea and vomiting.  Genitourinary:  Negative for flank pain, frequency and hematuria.  Musculoskeletal:  Negative for back pain, falls and myalgias.  Skin:  Negative for rash.  Neurological:  Positive for weakness. Negative for dizziness and headaches.  Endo/Heme/Allergies:  Does not bruise/bleed easily.  Psychiatric/Behavioral:  Negative for depression. The patient is not nervous/anxious and does not have insomnia.     Vital Signs: Blood pressure (!) 126/52, pulse 87, temperature 98.8 F (37.1 C), resp. rate 18, height 5\' 3"  (1.6 m), weight 65.2 kg, SpO2 97 %.  Weight trends: Filed Weights   09/29/22 0736  Weight: 65.2 kg    Physical Exam: General: NAD  Head: Normocephalic, atraumatic. Moist oral mucosal membranes  Eyes: Anicteric  Lungs:  Mild rales, normal effort  Heart: Regular rate and rhythm  Abdomen:  Soft, nontender  Extremities:  no peripheral edema.  Neurologic: Nonfocal, moving all four extremities  Skin: No lesions  Access: Lt AVF     Lab results: Basic Metabolic Panel: Recent Labs  Lab 09/29/22 0738 09/30/22 0744  NA 135 136  K 5.2* 4.2  CL 99 98  CO2 25 26  GLUCOSE 93 99  BUN 28* 17  CREATININE 7.51* 4.61*  CALCIUM 8.8* 8.6*  PHOS  --  6.6*    Liver Function Tests: Recent Labs  Lab 09/29/22 0738 09/30/22 0744  AST 22  --   ALT 10  --   ALKPHOS 100  --   BILITOT 0.7  --   PROT 5.9*  --   ALBUMIN 2.6* 2.3*   Recent Labs  Lab 09/29/22 0738  LIPASE 23   No results for input(s): "AMMONIA" in the last 168 hours.  CBC: Recent Labs  Lab 09/29/22 0738 09/30/22 0744  WBC 7.0 5.3  HGB 10.7* 10.0*  HCT 33.9* 30.5*  MCV 88.3 85.2   PLT 166 131*    Cardiac Enzymes: No results for input(s): "CKTOTAL", "CKMB", "CKMBINDEX", "TROPONINI" in the last 168 hours.  BNP: Invalid input(s): "POCBNP"  CBG: Recent Labs  Lab 09/29/22 1446  GLUCAP 98    Microbiology: Results for orders placed or performed during the hospital encounter of 09/29/22  Resp panel by RT-PCR (RSV, Flu A&B, Covid) Anterior Nasal Swab     Status: Abnormal   Collection Time: 09/29/22  9:40 AM   Specimen: Anterior Nasal Swab  Result Value Ref Range Status   SARS Coronavirus 2 by RT PCR POSITIVE (A) NEGATIVE Final    Comment: (NOTE) SARS-CoV-2 target nucleic acids are DETECTED.  The SARS-CoV-2 RNA is generally detectable in upper respiratory specimens during the acute phase of infection. Positive results are indicative of the presence of the identified virus, but do not rule out bacterial infection or co-infection with other pathogens not detected by the test. Clinical correlation with patient history and other diagnostic information is necessary to determine patient infection status. The expected result is Negative.  Fact Sheet for Patients: BloggerCourse.com  Fact Sheet for Healthcare Providers: SeriousBroker.it  This test is not yet approved or cleared by the [place] FDA and  has been authorized for detection and/or diagnosis of SARS-CoV-2 by FDA under an Emergency Use Authorization (EUA).  This EUA will remain in effect (meaning this test can be used) for the duration of  the COVID-19 declaration under Section 564(b)(1) of the A ct, 21 U.S.C. section 360bbb-3(b)(1), unless the authorization is terminated or revoked sooner.     Influenza A by PCR NEGATIVE NEGATIVE Final   Influenza B by PCR NEGATIVE NEGATIVE Final  Comment: (NOTE) The Xpert Xpress SARS-CoV-2/FLU/RSV plus assay is intended as an aid in the diagnosis of influenza from Nasopharyngeal swab specimens and should  not be used as a sole basis for treatment. Nasal washings and aspirates are unacceptable for Xpert Xpress SARS-CoV-2/FLU/RSV testing.  Fact Sheet for Patients: BloggerCourse.com  Fact Sheet for Healthcare Providers: SeriousBroker.it  This test is not yet approved or cleared by the [place] FDA and has been authorized for detection and/or diagnosis of SARS-CoV-2 by FDA under an Emergency Use Authorization (EUA). This EUA will remain in effect (meaning this test can be used) for the duration of the COVID-19 declaration under Section 564(b)(1) of the Act, 21 U.S.C. section 360bbb-3(b)(1), unless the authorization is terminated or revoked.     Resp Syncytial Virus by PCR NEGATIVE NEGATIVE Final    Comment: (NOTE) Fact Sheet for Patients: BloggerCourse.com  Fact Sheet for Healthcare Providers: SeriousBroker.it  This test is not yet approved or cleared by the [place] FDA and has been authorized for detection and/or diagnosis of SARS-CoV-2 by FDA under an Emergency Use Authorization (EUA). This EUA will remain in effect (meaning this test can be used) for the duration of the COVID-19 declaration under Section 564(b)(1) of the Act, 21 U.S.C. section 360bbb-3(b)(1), unless the authorization is terminated or revoked.  Performed at [facility], [address] [place], [place] [address]   MRSA Next Gen by PCR, Nasal     Status: None   Collection Time: 09/29/22  2:56 PM   Specimen: Nasal Mucosa; Nasal Swab  Result Value Ref Range Status   MRSA by PCR Next Gen NOT DETECTED NOT DETECTED Final    Comment: (NOTE) The GeneXpert MRSA Assay (FDA approved for NASAL specimens only), is one component of a comprehensive MRSA colonization surveillance program. It is not intended to diagnose MRSA infection nor to guide or monitor treatment for MRSA infections. Test  performance is not FDA approved in patients less than 38 years old. Performed at [facility], [address] [place], [place] [address]     Coagulation Studies: No results for input(s): "LABPROT", "INR" in the last 72 hours.  Urinalysis: No results for input(s): "COLORURINE", "LABSPEC", "PHURINE", "GLUCOSEU", "HGBUR", "BILIRUBINUR", "KETONESUR", "PROTEINUR", "UROBILINOGEN", "NITRITE", "LEUKOCYTESUR" in the last 72 hours.  Invalid input(s): "APPERANCEUR"    Imaging: DG Chest Port 1 View  Result Date: 09/30/2022 CLINICAL DATA:  77 year old female with history of pleural effusion. EXAM: PORTABLE CHEST 1 VIEW COMPARISON:  Chest x-ray 09/29/2022. FINDINGS: Small right and moderate left pleural effusions, enlarging on the left compared to the prior study. Patchy opacities throughout the mid to lower lungs bilaterally likely reflect a combination of atelectasis and consolidation, worsened compared to the prior examination. No appreciable pneumothorax. No evidence of pulmonary edema. Heart size appears mildly enlarged. The patient is rotated to the right on today's exam, resulting in distortion of the mediastinal contours and reduced diagnostic sensitivity and specificity for mediastinal pathology. Atherosclerotic calcifications are noted in the thoracic aorta. Multiple prominent mildly dilated loops of small bowel are noted in the left upper quadrant of the abdomen measuring up to 3.5 cm in diameter. IMPRESSION: 1. Worsening aeration in the lungs bilaterally likely reflective of increasing areas of atelectasis/consolidation, along with increasing small right and moderate left pleural effusions. 2. Mild cardiomegaly. 3. Aortic atherosclerosis. 4. Dilated loops of small bowel in the left upper quadrant of the abdomen. Correlation with abdominal radiograph is suggested if there is any clinical concern for small bowel obstruction  or ileus. Electronically Signed   By: Trudie Reed M.D.   On:  09/30/2022 05:59   CT Angio Chest PE W/Cm &/Or Wo Cm  Result Date: 09/29/2022 CLINICAL DATA:  Pulmonary embolus suspected EXAM: CT ANGIOGRAPHY CHEST WITH CONTRAST TECHNIQUE: Multidetector CT imaging of the chest was performed using the standard protocol during bolus administration of intravenous contrast. Multiplanar CT image reconstructions and MIPs were obtained to evaluate the vascular anatomy. RADIATION DOSE REDUCTION: This exam was performed according to the departmental dose-optimization program which includes automated exposure control, adjustment of the mA and/or kV according to patient size and/or use of iterative reconstruction technique. CONTRAST:  75mL OMNIPAQUE IOHEXOL 350 MG/ML SOLN COMPARISON:  Chest CT dated November 11th 2011 FINDINGS: Cardiovascular: Ill-defined filling defects in the right lower lobe segmental/subsegmental pulmonary arteries, likely due to slow flow given associated severe chronic lung disease at that location. No evidence acute pulmonary embolus. Mild cardiomegaly. No pericardial effusion. Normal caliber thoracic aorta with moderate atherosclerotic disease. Mild coronary artery calcifications. 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 Collateral flow via chest wall, right diaphragmatic and azygous veins. Mediastinum/Nodes: Esophagus and thyroid are unremarkable. No enlarged nodes seen in the chest. Lungs/Pleura: Central airways are patent. Mild bilateral bronchial wall thickening. Focal multiloculated cystic lesion of the right lower lobe, with slightly increased wall thickening and new air-fluid level seen on series 5, image 93. Loculated left-greater-than-right trace bilateral pleural effusions with loculated fissural fluid of the right lung. Bibasilar atelectasis. Upper Abdomen: No acute abnormality. Musculoskeletal: Body wall edema. No acute or significant osseous findings. Review of the MIP images confirms the  above findings. IMPRESSION: 1. Ill-defined filling defects in the right lower lobe segmental/subsegmental pulmonary arteries, likely due to slow flow given associated chronic lung disease of the right lower lobe. No evidence acute pulmonary embolus. 2. 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 3. Loculated trace left-greater-than-right pleural effusions with right fissural fluid. 4. Multiloculated cystic lesion of the right lower lobe with slightly increased wall thickening and new air-fluid level, concerning for superimposed infection. 5. Mild bilateral bronchial wall thickening, findings can be seen in the setting of bronchitis. 6.  Aortic Atherosclerosis (ICD10-I70.0). Electronically Signed   By: Allegra Lai M.D.   On: 09/29/2022 10:48   DG Chest 2 View  Result Date: 09/29/2022 CLINICAL DATA:  Chest pain EXAM: CHEST - 2 VIEW COMPARISON:  Chest x-ray dated April 18, 2017 FINDINGS: Cardiac and mediastinal contours are within normal limits. Chronic left lower lobe bronchiectasis. Increased patchy opacities of the bilateral lower lobes and right upper lobe. New small left-greater-than-right pleural effusions. IMPRESSION: 1. Increased patchy opacities of the bilateral lower lobes and right upper lobe. Recommend follow-up PA and lateral chest radiograph in 6-8 weeks to ensure resolution. 2. New small left-greater-than-right pleural effusions. Electronically Signed   By: Allegra Lai M.D.   On: 09/29/2022 08:24     Assessment & Plan: Betty Silva is a 77 y.o.  female with ast medical history of hypertension, hyperlipidemia, and end stage renal disease on hemodialysis, who was admitted to [facility] on 09/29/2022 for Pleural effusion [J90] Acute respiratory failure with hypoxia (HCC) [J96.01] ESRD on hemodialysis (HCC) [N18.6, Z99.2] Pneumonia due to infectious organism, unspecified laterality, unspecified part of  lung [J18.9] COVID-19 [U07.1]  End stage renal disease on hemodialysis. Last treatment received on Saturday. Missed treatment on Tuesday. Patient received treatment yesterday evening,  UF 2.5L achieved. Next treatment scheduled for  Thursday.   2. Acute respiratory failure due to fluid overload. Weaned to room air from 2L after fluid removal with dialysis.   3. Anemia of chronic kidney disease Lab Results  Component Value Date   HGB 10.0 (L) 09/30/2022    Hgb within optimal range. Will continue to monitor for need of ESA.   4. Hypertension with chronic kidney injury. Home regimen includes Amlodipine. Receiving this during admission.   5. Pneumonia, opacities seen on chest xray and pleural effusions evident on CT chest. Covid 19 positive. Antibiotics per primary team.     LOS: 1 Lord Lancour 7/10/20241:46 PM

## 2022-09-30 NOTE — Progress Notes (Signed)
patient in ICU bed.  Alert and oriented.  Informed consent signed and in chart.   TX duration:3  Patient tolerated well.  Alert, without acute distress.  Hand-off given to patient's nurse.   Access used: Fistula Access issues: none  Total UF removed: 2500 ml Medication(s) given: none Post HD VS: 129/57 Post HD weight: unable to obtain     09/29/22 2315  Vitals  Temp 97.6 F (36.4 C)  Temp Source Oral  BP (!) 129/57  MAP (mmHg) 79  BP Location Right Arm  BP Method Automatic  Patient Position (if appropriate) Lying  Pulse Rate 81  Pulse Rate Source Monitor  ECG Heart Rate 79  Resp 19  Oxygen Therapy  SpO2 96 %  O2 Device Nasal Cannula  O2 Flow Rate (L/min) 2 L/min  Patient Activity (if Appropriate) In bed  Pulse Oximetry Type Continuous  Post Treatment  Dialyzer Clearance Lightly streaked  Duration of HD Treatment -hour(s) 3 hour(s)  Hemodialysis Intake (mL) 0 mL  Liters Processed 71.9  Fluid Removed (mL) 2500 mL  Tolerated HD Treatment Yes  Post-Hemodialysis Comments HD tx completed as expected without complications, tolerated well, no acute distress, pt is stble.  AVG/AVF Arterial Site Held (minutes) 10 minutes  AVG/AVF Venous Site Held (minutes) 10 minutes  Note  Observations pt is in bed resting with eyes closed., no complaints.  Fistula / Graft Left Upper arm  Placement Date/Time: 09/29/22 (c) 1500   Placed prior to admission: Yes  Orientation: Left  Access Location: Upper arm  Site Condition No complications  Fistula / Graft Assessment Present;Thrill;Bruit  Status Deaccessed  Drainage Description None

## 2022-10-01 LAB — EXPECTORATED SPUTUM ASSESSMENT W GRAM STAIN, RFLX TO RESP C

## 2022-10-01 LAB — CBC
HCT: 34.2 % — ABNORMAL LOW (ref 36.0–46.0)
Hemoglobin: 11 g/dL — ABNORMAL LOW (ref 12.0–15.0)
MCH: 27.6 pg (ref 26.0–34.0)
MCHC: 32.2 g/dL (ref 30.0–36.0)
MCV: 85.7 fL (ref 80.0–100.0)
Platelets: 178 10*3/uL (ref 150–400)
RBC: 3.99 MIL/uL (ref 3.87–5.11)
RDW: 16.6 % — ABNORMAL HIGH (ref 11.5–15.5)
WBC: 5 10*3/uL (ref 4.0–10.5)
nRBC: 0 % (ref 0.0–0.2)

## 2022-10-01 LAB — BASIC METABOLIC PANEL
Anion gap: 14 (ref 5–15)
BUN: 33 mg/dL — ABNORMAL HIGH (ref 8–23)
CO2: 25 mmol/L (ref 22–32)
Calcium: 8.7 mg/dL — ABNORMAL LOW (ref 8.9–10.3)
Chloride: 97 mmol/L — ABNORMAL LOW (ref 98–111)
Creatinine, Ser: 5.82 mg/dL — ABNORMAL HIGH (ref 0.44–1.00)
GFR, Estimated: 7 mL/min — ABNORMAL LOW (ref >60)
Glucose, Bld: 89 mg/dL (ref 70–99)
Potassium: 4 mmol/L (ref 3.5–5.1)
Sodium: 136 mmol/L (ref 135–145)

## 2022-10-01 LAB — C-REACTIVE PROTEIN: CRP: 5.9 mg/dL — ABNORMAL HIGH (ref <1.0)

## 2022-10-01 LAB — PROCALCITONIN: Procalcitonin: 0.79 ng/mL

## 2022-10-01 LAB — MYCOPLASMA PNEUMONIAE ANTIBODY, IGM: Mycoplasma pneumo IgM: <770 U/mL (ref 0–769)

## 2022-10-01 LAB — MAGNESIUM: Magnesium: 2.2 mg/dL (ref 1.7–2.4)

## 2022-10-01 MED ORDER — IPRATROPIUM-ALBUTEROL 20-100 MCG/ACT IN AERS
1.0000 | INHALATION_SPRAY | Freq: Four times a day (QID) | RESPIRATORY_TRACT | Status: DC
  Administered 2022-10-01 – 2022-10-03 (×7): 1 via RESPIRATORY_TRACT
  Filled 2022-10-01: qty 4

## 2022-10-01 MED ORDER — GUAIFENESIN ER 600 MG PO TB12
600.0000 mg | ORAL_TABLET | Freq: Two times a day (BID) | ORAL | Status: DC
  Administered 2022-10-01 – 2022-10-03 (×5): 600 mg via ORAL
  Filled 2022-10-01 (×5): qty 1

## 2022-10-01 MED ORDER — DEXAMETHASONE 6 MG PO TABS
6.0000 mg | ORAL_TABLET | Freq: Every day | ORAL | Status: DC
  Administered 2022-10-01 – 2022-10-03 (×3): 6 mg via ORAL
  Filled 2022-10-01: qty 1
  Filled 2022-10-01: qty 3
  Filled 2022-10-01 (×2): qty 1

## 2022-10-01 MED ORDER — HYDROCOD POLI-CHLORPHE POLI ER 10-8 MG/5ML PO SUER
5.0000 mL | Freq: Two times a day (BID) | ORAL | Status: DC | PRN
  Administered 2022-10-01 – 2022-10-03 (×4): 5 mL via ORAL
  Filled 2022-10-01 (×4): qty 5

## 2022-10-01 NOTE — Plan of Care (Signed)
  Problem: Clinical Measurements: Goal: Ability to maintain clinical measurements within normal limits will improve Outcome: Progressing Goal: Will remain free from infection Outcome: Progressing Goal: Cardiovascular complication will be avoided Outcome: Progressing   Problem: Activity: Goal: Risk for activity intolerance will decrease Outcome: Progressing   Problem: Coping: Goal: Level of anxiety will decrease Outcome: Progressing   Problem: Pain Managment: Goal: General experience of comfort will improve Outcome: Progressing   Problem: Safety: Goal: Ability to remain free from injury will improve Outcome: Progressing   Problem: Clinical Measurements: Goal: Complications related to the disease process, condition or treatment will be avoided or minimized Outcome: Progressing

## 2022-10-01 NOTE — Discharge Planning (Signed)
ESTBLISHED HEMODIALYSIS Outpatient Facility  [facility]  [address]  [place] [address] [phone]  Scheduled days: Monday Wednesday and Friday  Treatment time: 11:00am  Patient Covid+, per clinic there will be no changes to patient schedule.

## 2022-10-01 NOTE — Progress Notes (Signed)
[facility]  ROUNDING NOTE   Subjective:   Patient seen resting in bed Alert and oriented  Spanish speaking Denies pain Room air  Dialysis scheduled for later today  Objective:  Vital signs in last 24 hours:  Temp:  [98.1 F (36.7 C)-98.9 F (37.2 C)] 98.4 F (36.9 C) (07/11 0839) Pulse Rate:  [75-89] 77 (07/11 0839) Resp:  [14-24] 20 (07/11 0839) BP: (125-145)/(50-64) 134/57 (07/11 0839) SpO2:  [92 %-97 %] 93 % (07/11 0839)  Weight change:  Filed Weights   09/29/22 0736  Weight: 65.2 kg    Intake/Output: I/O last 3 completed shifts: In: 1420.4 [P.O.:120; IV Piggyback:1300.4] Out: 2500 [Other:2500]   Intake/Output this shift:  No intake/output data recorded.  Physical Exam: General: NAD  Head: Normocephalic, atraumatic. Moist oral mucosal membranes  Eyes: Anicteric  Lungs:  Rales  Heart: Regular rate and rhythm  Abdomen:  Soft, nontender  Extremities:  No peripheral edema.  Neurologic: Nonfocal, moving all four extremities  Skin: No lesions  Access: Lt AVF    Basic Metabolic Panel: Recent Labs  Lab 09/29/22 0738 09/30/22 0744 10/01/22 0419  NA 135 136 136  K 5.2* 4.2 4.0  CL 99 98 97*  CO2 25 26 25   GLUCOSE 93 99 89  BUN 28* 17 33*  CREATININE 7.51* 4.61* 5.82*  CALCIUM 8.8* 8.6* 8.7*  MG  --   --  2.2  PHOS  --  6.6*  --     Liver Function Tests: Recent Labs  Lab 09/29/22 0738 09/30/22 0744  AST 22  --   ALT 10  --   ALKPHOS 100  --   BILITOT 0.7  --   PROT 5.9*  --   ALBUMIN 2.6* 2.3*   Recent Labs  Lab 09/29/22 0738  LIPASE 23   No results for input(s): "AMMONIA" in the last 168 hours.  CBC: Recent Labs  Lab 09/29/22 0738 09/30/22 0744 10/01/22 0419  WBC 7.0 5.3 5.0  HGB 10.7* 10.0* 11.0*  HCT 33.9* 30.5* 34.2*  MCV 88.3 85.2 85.7  PLT 166 131* 178    Cardiac Enzymes: No results for input(s): "CKTOTAL", "CKMB", "CKMBINDEX", "TROPONINI" in the last 168 hours.  BNP: Invalid input(s):  "POCBNP"  CBG: Recent Labs  Lab 09/29/22 1446  GLUCAP 98    Microbiology: Results for orders placed or performed during the hospital encounter of 09/29/22  Resp panel by RT-PCR (RSV, Flu A&B, Covid) Anterior Nasal Swab     Status: Abnormal   Collection Time: 09/29/22  9:40 AM   Specimen: Anterior Nasal Swab  Result Value Ref Range Status   SARS Coronavirus 2 by RT PCR POSITIVE (A) NEGATIVE Final    Comment: (NOTE) SARS-CoV-2 target nucleic acids are DETECTED.  The SARS-CoV-2 RNA is generally detectable in upper respiratory specimens during the acute phase of infection. Positive results are indicative of the presence of the identified virus, but do not rule out bacterial infection or co-infection with other pathogens not detected by the test. Clinical correlation with patient history and other diagnostic information is necessary to determine patient infection status. The expected result is Negative.  Fact Sheet for Patients: BloggerCourse.com  Fact Sheet for Healthcare Providers: SeriousBroker.it  This test is not yet approved or cleared by the [place] FDA and  has been authorized for detection and/or diagnosis of SARS-CoV-2 by FDA under an Emergency Use Authorization (EUA).  This EUA will remain in effect (meaning this test can be used) for the duration of  the COVID-19 declaration under Section 564(b)(1) of the A ct, 21 U.S.C. section 360bbb-3(b)(1), unless the authorization is terminated or revoked sooner.     Influenza A by PCR NEGATIVE NEGATIVE Final   Influenza B by PCR NEGATIVE NEGATIVE Final    Comment: (NOTE) The Xpert Xpress SARS-CoV-2/FLU/RSV plus assay is intended as an aid in the diagnosis of influenza from Nasopharyngeal swab specimens and should not be used as a sole basis for treatment. Nasal washings and aspirates are unacceptable for Xpert Xpress SARS-CoV-2/FLU/RSV testing.  Fact Sheet for  Patients: BloggerCourse.com  Fact Sheet for Healthcare Providers: SeriousBroker.it  This test is not yet approved or cleared by the [place] FDA and has been authorized for detection and/or diagnosis of SARS-CoV-2 by FDA under an Emergency Use Authorization (EUA). This EUA will remain in effect (meaning this test can be used) for the duration of the COVID-19 declaration under Section 564(b)(1) of the Act, 21 U.S.C. section 360bbb-3(b)(1), unless the authorization is terminated or revoked.     Resp Syncytial Virus by PCR NEGATIVE NEGATIVE Final    Comment: (NOTE) Fact Sheet for Patients: BloggerCourse.com  Fact Sheet for Healthcare Providers: SeriousBroker.it  This test is not yet approved or cleared by the [place] FDA and has been authorized for detection and/or diagnosis of SARS-CoV-2 by FDA under an Emergency Use Authorization (EUA). This EUA will remain in effect (meaning this test can be used) for the duration of the COVID-19 declaration under Section 564(b)(1) of the Act, 21 U.S.C. section 360bbb-3(b)(1), unless the authorization is terminated or revoked.  Performed at [facility], [address] [place], [place] [address]   MRSA Next Gen by PCR, Nasal     Status: None   Collection Time: 09/29/22  2:56 PM   Specimen: Nasal Mucosa; Nasal Swab  Result Value Ref Range Status   MRSA by PCR Next Gen NOT DETECTED NOT DETECTED Final    Comment: (NOTE) The GeneXpert MRSA Assay (FDA approved for NASAL specimens only), is one component of a comprehensive MRSA colonization surveillance program. It is not intended to diagnose MRSA infection nor to guide or monitor treatment for MRSA infections. Test performance is not FDA approved in patients less than 76 years old. Performed at [facility], [address] [place], [place] [address]   Culture,  blood (Routine X 2) w Reflex to ID Panel     Status: None (Preliminary result)   Collection Time: 09/29/22  3:16 PM   Specimen: BLOOD  Result Value Ref Range Status   Specimen Description BLOOD RAC  Final   Special Requests   Final    BOTTLES DRAWN AEROBIC ONLY Blood Culture results may not be optimal due to an inadequate volume of blood received in culture bottles   Culture   Final    NO GROWTH 2 DAYS Performed at [facility], [address], [place], [place] [address]    Report Status PENDING  Incomplete  Culture, blood (Routine X 2) w Reflex to ID Panel     Status: None (Preliminary result)   Collection Time: 09/29/22  3:17 PM   Specimen: BLOOD  Result Value Ref Range Status   Specimen Description BLOOD BLOOD RIGHT ARM RFOA  Final   Special Requests   Final    BOTTLES DRAWN AEROBIC ONLY Blood Culture adequate volume   Culture   Final    NO GROWTH 2 DAYS Performed at [facility], [address], [place], [place] [address]  Report Status PENDING  Incomplete  Expectorated Sputum Assessment w Gram Stain, Rflx to Resp Cult     Status: None (Preliminary result)   Collection Time: 10/01/22  8:35 AM   Specimen: Sputum  Result Value Ref Range Status   Specimen Description SPUTUM  Final   Special Requests NONE  Final   Sputum evaluation   Final    THIS SPECIMEN IS ACCEPTABLE FOR SPUTUM CULTURE Performed at [facility], [address] [place], [place] [address]    Report Status PENDING  Incomplete    Coagulation Studies: No results for input(s): "LABPROT", "INR" in the last 72 hours.  Urinalysis: No results for input(s): "COLORURINE", "LABSPEC", "PHURINE", "GLUCOSEU", "HGBUR", "BILIRUBINUR", "KETONESUR", "PROTEINUR", "UROBILINOGEN", "NITRITE", "LEUKOCYTESUR" in the last 72 hours.  Invalid input(s): "APPERANCEUR"    Imaging: DG Chest Port 1 View  Result Date: 09/30/2022 CLINICAL DATA:  77 year old female with history of pleural effusion.  EXAM: PORTABLE CHEST 1 VIEW COMPARISON:  Chest x-ray 09/29/2022. FINDINGS: Small right and moderate left pleural effusions, enlarging on the left compared to the prior study. Patchy opacities throughout the mid to lower lungs bilaterally likely reflect a combination of atelectasis and consolidation, worsened compared to the prior examination. No appreciable pneumothorax. No evidence of pulmonary edema. Heart size appears mildly enlarged. The patient is rotated to the right on today's exam, resulting in distortion of the mediastinal contours and reduced diagnostic sensitivity and specificity for mediastinal pathology. Atherosclerotic calcifications are noted in the thoracic aorta. Multiple prominent mildly dilated loops of small bowel are noted in the left upper quadrant of the abdomen measuring up to 3.5 cm in diameter. IMPRESSION: 1. Worsening aeration in the lungs bilaterally likely reflective of increasing areas of atelectasis/consolidation, along with increasing small right and moderate left pleural effusions. 2. Mild cardiomegaly. 3. Aortic atherosclerosis. 4. Dilated loops of small bowel in the left upper quadrant of the abdomen. Correlation with abdominal radiograph is suggested if there is any clinical concern for small bowel obstruction or ileus. Electronically Signed   By: Trudie Reed M.D.   On: 09/30/2022 05:59     Medications:    anticoagulant sodium citrate     cefTRIAXone (ROCEPHIN)  IV 1 g (09/30/22 2132)    amLODipine  5 mg Oral Daily   ascorbic acid  500 mg Oral Daily   azithromycin  500 mg Oral Q24H   Chlorhexidine Gluconate Cloth  6 each Topical Q0600   cinacalcet  30 mg Oral Q breakfast   ferrous sulfate  325 mg Oral Q breakfast   guaiFENesin  600 mg Oral BID   heparin injection (subcutaneous)  5,000 Units Subcutaneous Q8H   methylPREDNISolone (SOLU-MEDROL) injection  80 mg Intravenous Q24H   pravastatin  20 mg Oral q1800   zinc sulfate  220 mg Oral Daily   alteplase,  anticoagulant sodium citrate, chlorpheniramine-HYDROcodone, heparin, ipratropium-albuterol, lidocaine (PF), lidocaine-prilocaine, pentafluoroprop-tetrafluoroeth  Assessment/ Plan:  Ms. Chariah Bailey is a 77 y.o.  female  with past medical history of hypertension, hyperlipidemia, and end stage renal disease on hemodialysis, who was admitted to [facility] on 09/29/2022 for Pleural effusion [J90] Acute respiratory failure with hypoxia (HCC) [J96.01] ESRD on hemodialysis (HCC) [N18.6, Z99.2] Pneumonia due to infectious organism, unspecified laterality, unspecified part of lung [J18.9] COVID-19 [U07.1]   End stage renal disease on hemodialysis. Last treatment received on Saturday. Missed treatment on Tuesday. Patient received treatment yesterday evening, UF 2.5L achieved. Next treatment scheduled for Thursday.   2. Acute respiratory failure due to  fluid overload. Weaned to room air from 2L after fluid removal with dialysis.   3. Anemia of chronic kidney disease Lab Results  Component Value Date   HGB 11.0 (L) 10/01/2022    Hgb within optimal range. Will continue to monitor.    LOS: 2 Betty Silva 7/11/202411:31 AM

## 2022-10-01 NOTE — Progress Notes (Signed)
Patient has non pitting edema distal to her right PIV in the AV.  IV to consulted to verify that site could be used safely.  IV flushes and blood return noted, per IV team we may proceed with IV infusion

## 2022-10-01 NOTE — Progress Notes (Signed)
  PROGRESS NOTE    Betty Silva  NGE:[id number] DOB: 02/16/1946 DOA: 09/29/2022 PCP: [facility]  [id number]/[id number]  LOS: 2 days   Brief hospital course:   Assessment & Plan: Betty Silva is a 77 y.o. female with medical history significant of end-stage renal disease on hemodialysis Tuesday Thursday Saturday, hypertension, remote history of PE presenting with acute respiratory failure with hypoxia, COVID-19, multiloculated pneumonia.    * Acute respiratory failure with hypoxia (HCC) --on presentation requiring 2 L nasal cannula with noted cough, shortness of breath, malaise of multiple days  CTA chest with noted right lower lobe multiloculated pneumonia Patient also COVID-positive --tx both  PNA (pneumonia) Loculated trace pleural effusions Imaging concerning for superimposed infectious process. --PCCM discussed with IR, currently no need for pigtail cath.  Started on Linezolid by PCCM, d/c'ed since MRSA neg.   Plan: --cont azithro and ceftriaxone  COVID-19 --started on IV Decadron and Remdesivir --cont Remdesivir --switch to oral Decadron --start Combivent QID --cont Mucinex  Hypertension --cont amlodipine  Hyperlipidemia Continue statin  ESRD on HD TTS iHD per nephro   DVT prophylaxis: Heparin SQ Code Status: Full code  Family Communication: granddaughter updated at bedside today Level of care: Med-Surg Dispo:   The patient is from: home Anticipated d/c is to: home Anticipated d/c date is: Pt would like to go home soon, so can consider discharge tomorrow after 3 days of IV abx for PNA, and finish 2 more days of oral abx at home.   Subjective and Interval History:  Translated by granddaughter.  Pt reported feeling fine.  Had cough with sputum.   Objective: Vitals:   10/01/22 1730 10/01/22 1800 10/01/22 1830 10/01/22 1900  BP: (!) 152/62 (!) 150/59 133/64 (!) 147/61  Pulse: 76 76 73 82  Resp: 19 14 17 16   Temp:       TempSrc:      SpO2: 94% 95% 94% 94%  Weight:      Height:       No intake or output data in the 24 hours ending 10/01/22 1926  Filed Weights   09/29/22 0736 10/01/22 1615  Weight: 65.2 kg 61.6 kg    Examination:   Constitutional: NAD, AAOx3 HEENT: conjunctivae and lids normal, EOMI CV: No cyanosis.   RESP: normal respiratory effort, on RA, mucusy cough Neuro: II - XII grossly intact.   Psych: Normal mood and affect.     Data Reviewed: I have personally reviewed labs and imaging studies  Time spent: 35 minutes  Darlin Priestly, MD [facility] If 7PM-7AM, please contact night-coverage 10/01/2022, 7:26 PM

## 2022-10-01 NOTE — Progress Notes (Signed)
Patient is coughing and requesting for cough medication. No standing order available. Notified Dr. Arville Care and received orders. Will administer after Pharmacy verification.

## 2022-10-01 NOTE — Progress Notes (Signed)
Hemodialysis Note  Received patient in bed to unit. Alert and oriented. Informed consent signed and in chart.   Treatment initiated:1623 Treatment completed:1952  Patient tolerated treatment well. Patient indicates discomfort at PIV site.  Transported back to the room alert, without acute distress. Report given to patient's RN.  Access used: Left Upper Arm Fistula  Access issues:None   Total UF removed:2.4 liters  Medications given:None  Post HD VS: Stable  Post HD weight:59.7 kg   Bartolo Darter, RN  [facility]

## 2022-10-02 ENCOUNTER — Other Ambulatory Visit: Payer: Self-pay

## 2022-10-02 LAB — BASIC METABOLIC PANEL
Anion gap: 11 (ref 5–15)
BUN: 22 mg/dL (ref 8–23)
CO2: 26 mmol/L (ref 22–32)
Calcium: 8.4 mg/dL — ABNORMAL LOW (ref 8.9–10.3)
Chloride: 99 mmol/L (ref 98–111)
Creatinine, Ser: 4.1 mg/dL — ABNORMAL HIGH (ref 0.44–1.00)
GFR, Estimated: 11 mL/min — ABNORMAL LOW (ref >60)
Glucose, Bld: 77 mg/dL (ref 70–99)
Potassium: 3.5 mmol/L (ref 3.5–5.1)
Sodium: 136 mmol/L (ref 135–145)

## 2022-10-02 LAB — CBC
HCT: 33.3 % — ABNORMAL LOW (ref 36.0–46.0)
Hemoglobin: 11.1 g/dL — ABNORMAL LOW (ref 12.0–15.0)
MCH: 27.9 pg (ref 26.0–34.0)
MCHC: 33.3 g/dL (ref 30.0–36.0)
MCV: 83.7 fL (ref 80.0–100.0)
Platelets: 163 10*3/uL (ref 150–400)
RBC: 3.98 MIL/uL (ref 3.87–5.11)
RDW: 16.9 % — ABNORMAL HIGH (ref 11.5–15.5)
WBC: 5.4 10*3/uL (ref 4.0–10.5)
nRBC: 0.4 % — ABNORMAL HIGH (ref 0.0–0.2)

## 2022-10-02 LAB — MAGNESIUM: Magnesium: 2 mg/dL (ref 1.7–2.4)

## 2022-10-02 MED ORDER — CEFDINIR 300 MG PO CAPS
300.0000 mg | ORAL_CAPSULE | Freq: Every day | ORAL | 0 refills | Status: AC
  Filled 2022-10-02: qty 3, 3d supply, fill #0

## 2022-10-02 NOTE — Plan of Care (Signed)
  Problem: Education: Goal: Knowledge of risk factors and measures for prevention of condition will improve Outcome: Progressing   Problem: Coping: Goal: Psychosocial and spiritual needs will be supported Outcome: Progressing   Problem: Respiratory: Goal: Will maintain a patent airway Outcome: Progressing Goal: Complications related to the disease process, condition or treatment will be avoided or minimized Outcome: Progressing   Problem: Education: Goal: Knowledge of General Education information will improve Description: Including pain rating scale, medication(s)/side effects and non-pharmacologic comfort measures Outcome: Progressing   Problem: Health Behavior/Discharge Planning: Goal: Ability to manage health-related needs will improve Outcome: Progressing   Problem: Clinical Measurements: Goal: Ability to maintain clinical measurements within normal limits will improve Outcome: Progressing Goal: Will remain free from infection Outcome: Progressing Goal: Diagnostic test results will improve Outcome: Progressing Goal: Respiratory complications will improve Outcome: Progressing Goal: Cardiovascular complication will be avoided Outcome: Progressing   Problem: Activity: Goal: Risk for activity intolerance will decrease Outcome: Progressing   Problem: Nutrition: Goal: Adequate nutrition will be maintained Outcome: Progressing   Problem: Coping: Goal: Level of anxiety will decrease Outcome: Progressing   Problem: Elimination: Goal: Will not experience complications related to bowel motility Outcome: Progressing Goal: Will not experience complications related to urinary retention Outcome: Progressing   Problem: Pain Managment: Goal: General experience of comfort will improve Outcome: Progressing   Problem: Safety: Goal: Ability to remain free from injury will improve Outcome: Progressing   Problem: Skin Integrity: Goal: Risk for impaired skin integrity will  decrease Outcome: Progressing   Problem: Education: Goal: Knowledge of disease and its progression will improve Outcome: Progressing Goal: Individualized Educational Video(s) Outcome: Progressing   Problem: Fluid Volume: Goal: Compliance with measures to maintain balanced fluid volume will improve Outcome: Progressing   Problem: Health Behavior/Discharge Planning: Goal: Ability to manage health-related needs will improve Outcome: Progressing   Problem: Nutritional: Goal: Ability to make healthy dietary choices will improve Outcome: Progressing   Problem: Clinical Measurements: Goal: Complications related to the disease process, condition or treatment will be avoided or minimized Outcome: Progressing   

## 2022-10-02 NOTE — Progress Notes (Signed)
[facility]  ROUNDING NOTE   Subjective:   Patient seen resting in bed Grand-daughter at bedside  Room air Feels stronger Appetite improving slowly  Objective:  Vital signs in last 24 hours:  Temp:  [97.7 F (36.5 C)-98.3 F (36.8 C)] 98.2 F (36.8 C) (07/12 0409) Pulse Rate:  [72-90] 72 (07/12 0409) Resp:  [13-23] 19 (07/12 0409) BP: (133-168)/(53-68) 134/58 (07/12 0409) SpO2:  [92 %-97 %] 97 % (07/12 0409) Weight:  [61.6 kg] 61.6 kg (07/11 1615)  Weight change:  Filed Weights   09/29/22 0736 10/01/22 1615  Weight: 65.2 kg 61.6 kg    Intake/Output: I/O last 3 completed shifts: In: 120 [P.O.:120] Out: 2400 [Other:2400]   Intake/Output this shift:  No intake/output data recorded.  Physical Exam: General: NAD  Head: Normocephalic, atraumatic. Moist oral mucosal membranes  Eyes: Anicteric  Lungs:  Diminished  Heart: Regular rate and rhythm  Abdomen:  Soft, nontender  Extremities:  No peripheral edema.  Neurologic: Nonfocal, moving all four extremities  Skin: No lesions  Access: Lt AVF    Basic Metabolic Panel: Recent Labs  Lab 09/29/22 0738 09/30/22 0744 10/01/22 0419 10/02/22 0625  NA 135 136 136 136  K 5.2* 4.2 4.0 3.5  CL 99 98 97* 99  CO2 25 26 25 26   GLUCOSE 93 99 89 77  BUN 28* 17 33* 22  CREATININE 7.51* 4.61* 5.82* 4.10*  CALCIUM 8.8* 8.6* 8.7* 8.4*  MG  --   --  2.2 2.0  PHOS  --  6.6*  --   --     Liver Function Tests: Recent Labs  Lab 09/29/22 0738 09/30/22 0744  AST 22  --   ALT 10  --   ALKPHOS 100  --   BILITOT 0.7  --   PROT 5.9*  --   ALBUMIN 2.6* 2.3*   Recent Labs  Lab 09/29/22 0738  LIPASE 23   No results for input(s): "AMMONIA" in the last 168 hours.  CBC: Recent Labs  Lab 09/29/22 0738 09/30/22 0744 10/01/22 0419 10/02/22 0625  WBC 7.0 5.3 5.0 5.4  HGB 10.7* 10.0* 11.0* 11.1*  HCT 33.9* 30.5* 34.2* 33.3*  MCV 88.3 85.2 85.7 83.7  PLT 166 131* 178 163    Cardiac Enzymes: No results  for input(s): "CKTOTAL", "CKMB", "CKMBINDEX", "TROPONINI" in the last 168 hours.  BNP: Invalid input(s): "POCBNP"  CBG: Recent Labs  Lab 09/29/22 1446  GLUCAP 98    Microbiology: Results for orders placed or performed during the hospital encounter of 09/29/22  Resp panel by RT-PCR (RSV, Flu A&B, Covid) Anterior Nasal Swab     Status: Abnormal   Collection Time: 09/29/22  9:40 AM   Specimen: Anterior Nasal Swab  Result Value Ref Range Status   SARS Coronavirus 2 by RT PCR POSITIVE (A) NEGATIVE Final    Comment: (NOTE) SARS-CoV-2 target nucleic acids are DETECTED.  The SARS-CoV-2 RNA is generally detectable in upper respiratory specimens during the acute phase of infection. Positive results are indicative of the presence of the identified virus, but do not rule out bacterial infection or co-infection with other pathogens not detected by the test. Clinical correlation with patient history and other diagnostic information is necessary to determine patient infection status. The expected result is Negative.  Fact Sheet for Patients: BloggerCourse.com  Fact Sheet for Healthcare Providers: SeriousBroker.it  This test is not yet approved or cleared by the [place] FDA and  has been authorized for detection and/or diagnosis of  SARS-CoV-2 by FDA under an Emergency Use Authorization (EUA).  This EUA will remain in effect (meaning this test can be used) for the duration of  the COVID-19 declaration under Section 564(b)(1) of the A ct, 21 U.S.C. section 360bbb-3(b)(1), unless the authorization is terminated or revoked sooner.     Influenza A by PCR NEGATIVE NEGATIVE Final   Influenza B by PCR NEGATIVE NEGATIVE Final    Comment: (NOTE) The Xpert Xpress SARS-CoV-2/FLU/RSV plus assay is intended as an aid in the diagnosis of influenza from Nasopharyngeal swab specimens and should not be used as a sole basis for treatment. Nasal  washings and aspirates are unacceptable for Xpert Xpress SARS-CoV-2/FLU/RSV testing.  Fact Sheet for Patients: BloggerCourse.com  Fact Sheet for Healthcare Providers: SeriousBroker.it  This test is not yet approved or cleared by the [place] FDA and has been authorized for detection and/or diagnosis of SARS-CoV-2 by FDA under an Emergency Use Authorization (EUA). This EUA will remain in effect (meaning this test can be used) for the duration of the COVID-19 declaration under Section 564(b)(1) of the Act, 21 U.S.C. section 360bbb-3(b)(1), unless the authorization is terminated or revoked.     Resp Syncytial Virus by PCR NEGATIVE NEGATIVE Final    Comment: (NOTE) Fact Sheet for Patients: BloggerCourse.com  Fact Sheet for Healthcare Providers: SeriousBroker.it  This test is not yet approved or cleared by the [place] FDA and has been authorized for detection and/or diagnosis of SARS-CoV-2 by FDA under an Emergency Use Authorization (EUA). This EUA will remain in effect (meaning this test can be used) for the duration of the COVID-19 declaration under Section 564(b)(1) of the Act, 21 U.S.C. section 360bbb-3(b)(1), unless the authorization is terminated or revoked.  Performed at [facility], [address] [place], [place] [address]   MRSA Next Gen by PCR, Nasal     Status: None   Collection Time: 09/29/22  2:56 PM   Specimen: Nasal Mucosa; Nasal Swab  Result Value Ref Range Status   MRSA by PCR Next Gen NOT DETECTED NOT DETECTED Final    Comment: (NOTE) The GeneXpert MRSA Assay (FDA approved for NASAL specimens only), is one component of a comprehensive MRSA colonization surveillance program. It is not intended to diagnose MRSA infection nor to guide or monitor treatment for MRSA infections. Test performance is not FDA approved in patients less than 18  years old. Performed at [facility], [address] [place], [place] [address]   Culture, blood (Routine X 2) w Reflex to ID Panel     Status: None (Preliminary result)   Collection Time: 09/29/22  3:16 PM   Specimen: BLOOD  Result Value Ref Range Status   Specimen Description BLOOD RAC  Final   Special Requests   Final    BOTTLES DRAWN AEROBIC ONLY Blood Culture results may not be optimal due to an inadequate volume of blood received in culture bottles   Culture   Final    NO GROWTH 3 DAYS Performed at [facility], [address], [place], [place] [address]    Report Status PENDING  Incomplete  Culture, blood (Routine X 2) w Reflex to ID Panel     Status: None (Preliminary result)   Collection Time: 09/29/22  3:17 PM   Specimen: BLOOD  Result Value Ref Range Status   Specimen Description BLOOD BLOOD RIGHT ARM RFOA  Final   Special Requests   Final    BOTTLES DRAWN AEROBIC ONLY Blood Culture adequate volume  Culture   Final    NO GROWTH 3 DAYS Performed at [facility], [address] [place], [place] [address]    Report Status PENDING  Incomplete  Expectorated Sputum Assessment w Gram Stain, Rflx to Resp Cult     Status: None   Collection Time: 10/01/22  8:35 AM   Specimen: Sputum  Result Value Ref Range Status   Specimen Description SPUTUM  Final   Special Requests NONE  Final   Sputum evaluation   Final    THIS SPECIMEN IS ACCEPTABLE FOR SPUTUM CULTURE Performed at [facility], [address] [place], [place] [address]    Report Status 10/01/2022 FINAL  Final  Culture, Respiratory w Gram Stain     Status: None (Preliminary result)   Collection Time: 10/01/22  8:35 AM   Specimen: SPU  Result Value Ref Range Status   Specimen Description   Final    SPUTUM Performed at [facility], [address], [place], [place] [address]    Special Requests   Final    NONE Reflexed from [id number] Performed at [facility], [address] [place], [place] [address]    Gram Stain   Final    NO WBC SEEN NO ORGANISMS SEEN Performed at [facility], [address], [place], [place] [address]    Culture PENDING  Incomplete   Report Status PENDING  Incomplete    Coagulation Studies: No results for input(s): "LABPROT", "INR" in the last 72 hours.  Urinalysis: No results for input(s): "COLORURINE", "LABSPEC", "PHURINE", "GLUCOSEU", "HGBUR", "BILIRUBINUR", "KETONESUR", "PROTEINUR", "UROBILINOGEN", "NITRITE", "LEUKOCYTESUR" in the last 72 hours.  Invalid input(s): "APPERANCEUR"    Imaging: No results found.   Medications:    anticoagulant sodium citrate     cefTRIAXone (ROCEPHIN)  IV 1 g (10/01/22 2109)    amLODipine  5 mg Oral Daily   ascorbic acid  500 mg Oral Daily   azithromycin  500 mg Oral Q24H   Chlorhexidine Gluconate Cloth  6 each Topical Q0600   cinacalcet  30 mg Oral Q breakfast   dexamethasone  6 mg Oral Daily   ferrous sulfate  325 mg Oral Q breakfast   guaiFENesin  600 mg Oral BID   heparin injection (subcutaneous)  5,000 Units Subcutaneous Q8H   Ipratropium-Albuterol  1 puff Inhalation QID   pravastatin  20 mg Oral q1800   zinc sulfate  220 mg Oral Daily   alteplase, anticoagulant sodium citrate, chlorpheniramine-HYDROcodone, heparin, ipratropium-albuterol, lidocaine (PF), lidocaine-prilocaine, pentafluoroprop-tetrafluoroeth  Assessment/ Plan:  Ms. Betty Silva is a 77 y.o.  female  with past medical history of hypertension, hyperlipidemia, and end stage renal disease on hemodialysis, who was admitted to [facility] on 09/29/2022 for Pleural effusion [J90] Acute respiratory failure with hypoxia (HCC) [J96.01] ESRD on hemodialysis (HCC) [N18.6, Z99.2] Pneumonia due to infectious organism, unspecified laterality, unspecified part of lung [J18.9] COVID-19 [U07.1]   End stage renal disease on hemodialysis.  Patient received treatment yesterday, UF 2.4L. Next treatment  scheduled for Saturday  2. Acute respiratory failure due to fluid overload. Room air  3. Anemia of chronic kidney disease Lab Results  Component Value Date   HGB 11.1 (L) 10/02/2022    Hgb at goal. Will continue to monitor.    LOS: 3 Betty Silva 7/12/202412:38 PM

## 2022-10-02 NOTE — Progress Notes (Signed)
PROGRESS NOTE    Betty Silva  UJW:[id number] DOB: 1945/12/12 DOA: 09/29/2022 PCP: [facility]   Brief Narrative:  This 77 yrs old female with medical history significant of end-stage renal disease on hemodialysis Tuesday Thursday Saturday, hypertension, remote history of PE presenting with acute respiratory failure with hypoxia, COVID-19, multiloculated pneumonia.  Patient started on remdesivir, Decadron and IV antibiotics.  Assessment & Plan:   Principal Problem:   Acute respiratory failure with hypoxia (HCC) Active Problems:   PNA (pneumonia)   COVID-19   ESRD (end stage renal disease) (HCC)   Hyperlipidemia   Hypertension   Acute hypoxic respiratory failure: Patient was hypoxic on presentation requiring 2 L nasal cannula with noted cough, shortness of breath, malaise for multiple days  CTA chest with noted right lower lobe multiloculated pneumonia Patient also COVID-positive. Continue Decadron, nebulized bronchodilators and remdesivir. Continue empiric antibiotics ceftriaxone and Zithromax. Continue supplemental oxygen and wean as tolerated.   PNA (pneumonia) Loculated trace pleural effusions: Imaging concerning for superimposed infectious process. -PCCM discussed with IR, currently no need for pigtail cath.   Started on Linezolid by PCCM, d/c'ed since MRSA neg.   Plan: Continue azithro and ceftriaxone x 5 days.   COVID-19: --She was started on IV Decadron and Remdesivir. --Continue Remdesivir --Switch to oral Decadron. --Continue Combivent QID --Continue Mucinex   Hypertension --Continue amlodipine   Hyperlipidemia Continue statin   ESRD on HD TTS Continue Hemodialysis as per Nephro   DVT prophylaxis:  Heparin SQ Code Status:Full code Family Communication:  Grand daughter at bed side. Disposition Plan:    Status is: Inpatient Remains inpatient appropriate because: Admitted for acute hypoxic respiratory failure secondary to  pneumonia and COVID infection requiring remdesivir and IV antibiotics.   Consultants:  None  Procedures: None  Antimicrobials:  Anti-infectives (From admission, onward)    Start     Dose/Rate Route Frequency Ordered Stop   09/30/22 1900  cefTRIAXone (ROCEPHIN) 1 g in sodium chloride 0.9 % 100 mL IVPB        1 g 200 mL/hr over 30 Minutes Intravenous Every 24 hours 09/30/22 1756     09/30/22 1900  azithromycin (ZITHROMAX) tablet 500 mg        500 mg Oral Every 24 hours 09/30/22 1756     09/30/22 1000  remdesivir 100 mg in sodium chloride 0.9 % 100 mL IVPB       Placed in "Followed by" Linked Group   100 mg 200 mL/hr over 30 Minutes Intravenous Daily 09/29/22 1222 10/01/22 1101   09/29/22 1500  remdesivir 200 mg in sodium chloride 0.9% 250 mL IVPB       Placed in "Followed by" Linked Group   200 mg 580 mL/hr over 30 Minutes Intravenous Once 09/29/22 1222 09/29/22 1914   09/29/22 1330  linezolid (ZYVOX) IVPB 600 mg  Status:  Discontinued        600 mg 300 mL/hr over 60 Minutes Intravenous Every 12 hours 09/29/22 1253 09/30/22 1755   09/29/22 1115  ceFEPIme (MAXIPIME) 1 g in sodium chloride 0.9 % 100 mL IVPB        1 g 200 mL/hr over 30 Minutes Intravenous  Once 09/29/22 1104 09/29/22 1901   09/29/22 1115  azithromycin (ZITHROMAX) 500 mg in sodium chloride 0.9 % 250 mL IVPB        500 mg 250 mL/hr over 60 Minutes Intravenous  Once 09/29/22 1104 09/29/22 1410      Subjective: Patient was seen and  examined at bedside.  Overnight events noted. Patient reports doing better but does not feel comfortable going home today.   She still reports feeling weak and tired,  having pain with coughing and congestion.  Objective: Vitals:   10/01/22 2000 10/01/22 2001 10/01/22 2112 10/02/22 0409  BP: (!) 139/53  (!) 140/55 (!) 134/58  Pulse: 77  78 72  Resp: 15  20 19   Temp: 98 F (36.7 C)  98.3 F (36.8 C) 98.2 F (36.8 C)  TempSrc: Oral Oral Oral Oral  SpO2: 95%  95% 97%  Weight:       Height:        Intake/Output Summary (Last 24 hours) at 10/02/2022 1151 Last data filed at 10/02/2022 8119 Gross per 24 hour  Intake 120 ml  Output 2400 ml  Net -2280 ml   Filed Weights   09/29/22 0736 10/01/22 1615  Weight: 65.2 kg 61.6 kg    Examination:  General exam: Appears calm and comfortable, deconditioned, not in any acute distress. Respiratory system: Clear to auscultation. Respiratory effort normal.  RR 14 Cardiovascular system: S1 & S2 heard, RRR. No JVD, murmurs, rubs, gallops or clicks.  Gastrointestinal system: Abdomen is soft, non tender, non distended, normal bowel sounds heard. Central nervous system: Alert and oriented x 3. No focal neurological deficits. Extremities: No edema, no cyanosis, no clubbing Skin: No rashes, lesions or ulcers Psychiatry: Judgement and insight appear normal. Mood & affect appropriate.     Data Reviewed: I have personally reviewed following labs and imaging studies  CBC: Recent Labs  Lab 09/29/22 0738 09/30/22 0744 10/01/22 0419 10/02/22 0625  WBC 7.0 5.3 5.0 5.4  HGB 10.7* 10.0* 11.0* 11.1*  HCT 33.9* 30.5* 34.2* 33.3*  MCV 88.3 85.2 85.7 83.7  PLT 166 131* 178 163   Basic Metabolic Panel: Recent Labs  Lab 09/29/22 0738 09/30/22 0744 10/01/22 0419 10/02/22 0625  NA 135 136 136 136  K 5.2* 4.2 4.0 3.5  CL 99 98 97* 99  CO2 25 26 25 26   GLUCOSE 93 99 89 77  BUN 28* 17 33* 22  CREATININE 7.51* 4.61* 5.82* 4.10*  CALCIUM 8.8* 8.6* 8.7* 8.4*  MG  --   --  2.2 2.0  PHOS  --  6.6*  --   --    GFR: Estimated Creatinine Clearance: 9.8 mL/min (A) (by C-G formula based on SCr of 4.1 mg/dL (H)). Liver Function Tests: Recent Labs  Lab 09/29/22 0738 09/30/22 0744  AST 22  --   ALT 10  --   ALKPHOS 100  --   BILITOT 0.7  --   PROT 5.9*  --   ALBUMIN 2.6* 2.3*   Recent Labs  Lab 09/29/22 0738  LIPASE 23   No results for input(s): "AMMONIA" in the last 168 hours. Coagulation Profile: No results for  input(s): "INR", "PROTIME" in the last 168 hours. Cardiac Enzymes: No results for input(s): "CKTOTAL", "CKMB", "CKMBINDEX", "TROPONINI" in the last 168 hours. BNP (last 3 results) No results for input(s): "PROBNP" in the last 8760 hours. HbA1C: No results for input(s): "HGBA1C" in the last 72 hours. CBG: Recent Labs  Lab 09/29/22 1446  GLUCAP 98   Lipid Profile: No results for input(s): "CHOL", "HDL", "LDLCALC", "TRIG", "CHOLHDL", "LDLDIRECT" in the last 72 hours. Thyroid Function Tests: No results for input(s): "TSH", "T4TOTAL", "FREET4", "T3FREE", "THYROIDAB" in the last 72 hours. Anemia Panel: Recent Labs    09/29/22 1516 09/30/22 0744  FERRITIN 235 258   Sepsis  Labs: Recent Labs  Lab 09/29/22 0940 09/30/22 0744 10/01/22 0419  PROCALCITON 0.42 0.71 0.79    Recent Results (from the past 240 hour(s))  Resp panel by RT-PCR (RSV, Flu A&B, Covid) Anterior Nasal Swab     Status: Abnormal   Collection Time: 09/29/22  9:40 AM   Specimen: Anterior Nasal Swab  Result Value Ref Range Status   SARS Coronavirus 2 by RT PCR POSITIVE (A) NEGATIVE Final    Comment: (NOTE) SARS-CoV-2 target nucleic acids are DETECTED.  The SARS-CoV-2 RNA is generally detectable in upper respiratory specimens during the acute phase of infection. Positive results are indicative of the presence of the identified virus, but do not rule out bacterial infection or co-infection with other pathogens not detected by the test. Clinical correlation with patient history and other diagnostic information is necessary to determine patient infection status. The expected result is Negative.  Fact Sheet for Patients: BloggerCourse.com  Fact Sheet for Healthcare Providers: SeriousBroker.it  This test is not yet approved or cleared by the [place] FDA and  has been authorized for detection and/or diagnosis of SARS-CoV-2 by FDA under an Emergency Use  Authorization (EUA).  This EUA will remain in effect (meaning this test can be used) for the duration of  the COVID-19 declaration under Section 564(b)(1) of the A ct, 21 U.S.C. section 360bbb-3(b)(1), unless the authorization is terminated or revoked sooner.     Influenza A by PCR NEGATIVE NEGATIVE Final   Influenza B by PCR NEGATIVE NEGATIVE Final    Comment: (NOTE) The Xpert Xpress SARS-CoV-2/FLU/RSV plus assay is intended as an aid in the diagnosis of influenza from Nasopharyngeal swab specimens and should not be used as a sole basis for treatment. Nasal washings and aspirates are unacceptable for Xpert Xpress SARS-CoV-2/FLU/RSV testing.  Fact Sheet for Patients: BloggerCourse.com  Fact Sheet for Healthcare Providers: SeriousBroker.it  This test is not yet approved or cleared by the [place] FDA and has been authorized for detection and/or diagnosis of SARS-CoV-2 by FDA under an Emergency Use Authorization (EUA). This EUA will remain in effect (meaning this test can be used) for the duration of the COVID-19 declaration under Section 564(b)(1) of the Act, 21 U.S.C. section 360bbb-3(b)(1), unless the authorization is terminated or revoked.     Resp Syncytial Virus by PCR NEGATIVE NEGATIVE Final    Comment: (NOTE) Fact Sheet for Patients: BloggerCourse.com  Fact Sheet for Healthcare Providers: SeriousBroker.it  This test is not yet approved or cleared by the [place] FDA and has been authorized for detection and/or diagnosis of SARS-CoV-2 by FDA under an Emergency Use Authorization (EUA). This EUA will remain in effect (meaning this test can be used) for the duration of the COVID-19 declaration under Section 564(b)(1) of the Act, 21 U.S.C. section 360bbb-3(b)(1), unless the authorization is terminated or revoked.  Performed at [facility], [address] [place], [place] [address]   MRSA Next Gen by PCR, Nasal     Status: None   Collection Time: 09/29/22  2:56 PM   Specimen: Nasal Mucosa; Nasal Swab  Result Value Ref Range Status   MRSA by PCR Next Gen NOT DETECTED NOT DETECTED Final    Comment: (NOTE) The GeneXpert MRSA Assay (FDA approved for NASAL specimens only), is one component of a comprehensive MRSA colonization surveillance program. It is not intended to diagnose MRSA infection nor to guide or monitor treatment for MRSA infections. Test performance is not FDA approved in patients less than 2 years  old. Performed at [facility], [address] [place], [place] [address]   Culture, blood (Routine X 2) w Reflex to ID Panel     Status: None (Preliminary result)   Collection Time: 09/29/22  3:16 PM   Specimen: BLOOD  Result Value Ref Range Status   Specimen Description BLOOD RAC  Final   Special Requests   Final    BOTTLES DRAWN AEROBIC ONLY Blood Culture results may not be optimal due to an inadequate volume of blood received in culture bottles   Culture   Final    NO GROWTH 3 DAYS Performed at [facility], [address], [place], [place] [address]    Report Status PENDING  Incomplete  Culture, blood (Routine X 2) w Reflex to ID Panel     Status: None (Preliminary result)   Collection Time: 09/29/22  3:17 PM   Specimen: BLOOD  Result Value Ref Range Status   Specimen Description BLOOD BLOOD RIGHT ARM RFOA  Final   Special Requests   Final    BOTTLES DRAWN AEROBIC ONLY Blood Culture adequate volume   Culture   Final    NO GROWTH 3 DAYS Performed at [facility], [address] [place], [place] [address]    Report Status PENDING  Incomplete  Expectorated Sputum Assessment w Gram Stain, Rflx to Resp Cult     Status: None   Collection Time: 10/01/22  8:35 AM   Specimen: Sputum  Result Value Ref Range Status   Specimen Description SPUTUM  Final   Special Requests NONE  Final    Sputum evaluation   Final    THIS SPECIMEN IS ACCEPTABLE FOR SPUTUM CULTURE Performed at [facility], [address] [place], [place] [address]    Report Status 10/01/2022 FINAL  Final  Culture, Respiratory w Gram Stain     Status: None (Preliminary result)   Collection Time: 10/01/22  8:35 AM   Specimen: SPU  Result Value Ref Range Status   Specimen Description   Final    SPUTUM Performed at [facility], [address] [place], [place] [address]    Special Requests   Final    NONE Reflexed from [id number] Performed at [facility], [address] [place], [place] [address]    Gram Stain   Final    NO WBC SEEN NO ORGANISMS SEEN Performed at [facility], [address], [place], [place] [address]    Culture PENDING  Incomplete   Report Status PENDING  Incomplete    Radiology Studies: No results found.  Scheduled Meds:  amLODipine  5 mg Oral Daily   ascorbic acid  500 mg Oral Daily   azithromycin  500 mg Oral Q24H   Chlorhexidine Gluconate Cloth  6 each Topical Q0600   cinacalcet  30 mg Oral Q breakfast   dexamethasone  6 mg Oral Daily   ferrous sulfate  325 mg Oral Q breakfast   guaiFENesin  600 mg Oral BID   heparin injection (subcutaneous)  5,000 Units Subcutaneous Q8H   Ipratropium-Albuterol  1 puff Inhalation QID   pravastatin  20 mg Oral q1800   zinc sulfate  220 mg Oral Daily   Continuous Infusions:  anticoagulant sodium citrate     cefTRIAXone (ROCEPHIN)  IV 1 g (10/01/22 2109)     LOS: 3 days    Time spent: 50 mins    Willeen Niece, MD [facility]   If 7PM-7AM, please contact  night-coverage

## 2022-10-02 NOTE — Consult Note (Signed)
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 This is to be topped with a dry gauze and secured with a few turns of Kerlix roll gauze/paper tape.  WOC nursing team will not follow, but will remain available to this patient, the nursing and medical teams.  Please re-consult if needed.  Thank you for inviting [place] to participate in this patient's Plan of Care.  Ladona Mow, MSN, RN, CNS, GNP, Leda Min, [employer], [employer] phone:  [phone]

## 2022-10-02 NOTE — Progress Notes (Signed)
Mobility Specialist - Progress Note     10/02/22 1000  Mobility  Activity Ambulated with assistance in room  Level of Assistance Standby assist, set-up cues, supervision of patient - no hands on  Assistive Device None  Distance Ambulated (ft) 10 ft  Range of Motion/Exercises Active  Activity Response Tolerated well  Mobility Referral Yes  $Mobility charge 1 Mobility  Mobility Specialist Start Time (ACUTE ONLY) 1012  Mobility Specialist Stop Time (ACUTE ONLY) 1027  Mobility Specialist Time Calculation (min) (ACUTE ONLY) 15 min   Author responding to call out to Nursing Desk. Pt OOB ambulating to bathroom upon entry. Pt endorses no pain and returned to bed SBA with no AD and left with needs in reach. RN notified of completion of IV medicine cycle.   Johnathan Hausen Mobility Specialist 10/02/22, 10:56 AM

## 2022-10-02 NOTE — TOC CM/SW Note (Signed)
Transition of Care [facility]) - Inpatient Brief Assessment   Patient Details  Name: Betty Silva MRN: [id number] Date of Birth: October 30, 1945  [facility] CM/SW Contact:    Margarito Liner, LCSW Phone Number: 10/02/2022, 2:01 PM   Clinical Narrative: CSW reviewed chart. No TOC needs identified at this time. Per MD, likely discharge tomorrow. Patient does not have insurance so CSW asked MD to send prescription(s) to [facility] pharmacy to be filled today. Pharmacy is aware. CSW will continue to follow progress. Please place [facility] consult if any needs arise.  Transition of Care Asessment: Insurance and Status: Selfpay Patient has primary care physician: Yes Home environment has been reviewed: Mobile home Prior level of function:: Not documented Prior/Current Home Services: No current home services Social Determinants of Health Reivew: SDOH reviewed no interventions necessary Readmission risk has been reviewed: Yes Transition of care needs: no transition of care needs at this time

## 2022-10-03 ENCOUNTER — Other Ambulatory Visit: Payer: Self-pay

## 2022-10-03 LAB — BASIC METABOLIC PANEL
Anion gap: 10 (ref 5–15)
BUN: 13 mg/dL (ref 8–23)
CO2: 28 mmol/L (ref 22–32)
Calcium: 7.9 mg/dL — ABNORMAL LOW (ref 8.9–10.3)
Chloride: 97 mmol/L — ABNORMAL LOW (ref 98–111)
Creatinine, Ser: 2.51 mg/dL — ABNORMAL HIGH (ref 0.44–1.00)
GFR, Estimated: 19 mL/min — ABNORMAL LOW (ref >60)
Glucose, Bld: 79 mg/dL (ref 70–99)
Potassium: 3.1 mmol/L — ABNORMAL LOW (ref 3.5–5.1)
Sodium: 135 mmol/L (ref 135–145)

## 2022-10-03 LAB — MAGNESIUM: Magnesium: 1.7 mg/dL (ref 1.7–2.4)

## 2022-10-03 LAB — CBC
HCT: 35.8 % — ABNORMAL LOW (ref 36.0–46.0)
Hemoglobin: 11.6 g/dL — ABNORMAL LOW (ref 12.0–15.0)
MCH: 27.3 pg (ref 26.0–34.0)
MCHC: 32.4 g/dL (ref 30.0–36.0)
MCV: 84.2 fL (ref 80.0–100.0)
Platelets: 156 10*3/uL (ref 150–400)
RBC: 4.25 MIL/uL (ref 3.87–5.11)
RDW: 16.8 % — ABNORMAL HIGH (ref 11.5–15.5)
WBC: 4.7 10*3/uL (ref 4.0–10.5)
nRBC: 0.4 % — ABNORMAL HIGH (ref 0.0–0.2)

## 2022-10-03 LAB — CULTURE, RESPIRATORY W GRAM STAIN: Gram Stain: NONE SEEN

## 2022-10-03 MED ORDER — PRAVASTATIN SODIUM 20 MG PO TABS
20.0000 mg | ORAL_TABLET | Freq: Every day | ORAL | 1 refills | Status: AC
Start: 2022-10-03 — End: ?
  Filled 2022-10-03: qty 30, 30d supply, fill #0

## 2022-10-03 MED ORDER — DEXAMETHASONE 4 MG PO TABS
6.0000 mg | ORAL_TABLET | Freq: Every day | ORAL | 0 refills | Status: AC
  Filled 2022-10-03 – 2022-10-04 (×2): qty 5, 3d supply, fill #0

## 2022-10-03 MED ORDER — FERROUS SULFATE 325 (65 FE) MG PO TABS
325.0000 mg | ORAL_TABLET | Freq: Every day | ORAL | 0 refills | Status: AC
  Filled 2022-10-03: qty 30, 30d supply, fill #0

## 2022-10-03 MED ORDER — POTASSIUM CHLORIDE 20 MEQ PO PACK
40.0000 meq | PACK | Freq: Once | ORAL | Status: AC
  Administered 2022-10-03: 40 meq via ORAL
  Filled 2022-10-03: qty 2

## 2022-10-03 NOTE — Progress Notes (Signed)
Doctor called to ask why patient is still here although there is a discharge order. Discharge  AVS must be in Spanish and all appointments must be in Spanish to offer culturally competent nursing care Pt does not speak English Pt does not have a ride or mode of transportation at this time since her family is not in the room

## 2022-10-03 NOTE — Plan of Care (Signed)
Problem: Education: Goal: Knowledge of risk factors and measures for prevention of condition will improve 10/03/2022 1556 by Alver Fisher, RN Outcome: Adequate for Discharge 10/03/2022 1555 by Alver Fisher, RN Outcome: Progressing   Problem: Coping: Goal: Psychosocial and spiritual needs will be supported 10/03/2022 1556 by Alver Fisher, RN Outcome: Adequate for Discharge 10/03/2022 1555 by Alver Fisher, RN Outcome: Progressing   Problem: Respiratory: Goal: Will maintain a patent airway 10/03/2022 1556 by Alver Fisher, RN Outcome: Adequate for Discharge 10/03/2022 1555 by Alver Fisher, RN Outcome: Progressing Goal: Complications related to the disease process, condition or treatment will be avoided or minimized 10/03/2022 1556 by Alver Fisher, RN Outcome: Adequate for Discharge 10/03/2022 1555 by Alver Fisher, RN Outcome: Progressing   Problem: Education: Goal: Knowledge of General Education information will improve Description: Including pain rating scale, medication(s)/side effects and non-pharmacologic comfort measures 10/03/2022 1556 by Alver Fisher, RN Outcome: Adequate for Discharge 10/03/2022 1555 by Alver Fisher, RN Outcome: Progressing   Problem: Clinical Measurements: Goal: Ability to maintain clinical measurements within normal limits will improve 10/03/2022 1556 by Alver Fisher, RN Outcome: Adequate for Discharge 10/03/2022 1555 by Alver Fisher, RN Outcome: Progressing Goal: Will remain free from infection 10/03/2022 1556 by Alver Fisher, RN Outcome: Adequate for Discharge 10/03/2022 1555 by Alver Fisher, RN Outcome: Progressing Goal: Diagnostic test results will improve 10/03/2022 1556 by Alver Fisher, RN Outcome: Adequate for Discharge 10/03/2022 1555 by Alver Fisher, RN Outcome: Progressing Goal: Respiratory complications will improve 10/03/2022 1556 by  Alver Fisher, RN Outcome: Adequate for Discharge 10/03/2022 1555 by Alver Fisher, RN Outcome: Progressing Goal: Cardiovascular complication will be avoided 10/03/2022 1556 by Alver Fisher, RN Outcome: Adequate for Discharge 10/03/2022 1555 by Alver Fisher, RN Outcome: Progressing   Problem: Health Behavior/Discharge Planning: Goal: Ability to manage health-related needs will improve 10/03/2022 1556 by Alver Fisher, RN Outcome: Adequate for Discharge 10/03/2022 1555 by Alver Fisher, RN Outcome: Progressing   Problem: Nutrition: Goal: Adequate nutrition will be maintained 10/03/2022 1556 by Alver Fisher, RN Outcome: Adequate for Discharge 10/03/2022 1555 by Alver Fisher, RN Outcome: Progressing   Problem: Activity: Goal: Risk for activity intolerance will decrease 10/03/2022 1556 by Alver Fisher, RN Outcome: Adequate for Discharge 10/03/2022 1555 by Alver Fisher, RN Outcome: Progressing   Problem: Coping: Goal: Level of anxiety will decrease 10/03/2022 1556 by Alver Fisher, RN Outcome: Adequate for Discharge 10/03/2022 1555 by Alver Fisher, RN Outcome: Progressing   Problem: Elimination: Goal: Will not experience complications related to bowel motility 10/03/2022 1556 by Alver Fisher, RN Outcome: Adequate for Discharge 10/03/2022 1555 by Alver Fisher, RN Outcome: Progressing Goal: Will not experience complications related to urinary retention 10/03/2022 1556 by Alver Fisher, RN Outcome: Adequate for Discharge 10/03/2022 1555 by Alver Fisher, RN Outcome: Progressing   Problem: Pain Managment: Goal: General experience of comfort will improve 10/03/2022 1556 by Alver Fisher, RN Outcome: Adequate for Discharge 10/03/2022 1555 by Alver Fisher, RN Outcome: Progressing   Problem: Safety: Goal: Ability to remain free from injury will improve 10/03/2022 1556 by  Alver Fisher, RN Outcome: Adequate for Discharge 10/03/2022 1555 by Alver Fisher, RN Outcome: Progressing   Problem: Skin Integrity: Goal: Risk for impaired skin integrity will decrease 10/03/2022 1556 by Alver Fisher, RN Outcome: Adequate for Discharge 10/03/2022 1555 by Alver Fisher, RN Outcome:  Progressing   Problem: Education: Goal: Knowledge of disease and its progression will improve 10/03/2022 1556 by Alver Fisher, RN Outcome: Adequate for Discharge 10/03/2022 1555 by Alver Fisher, RN Outcome: Progressing Goal: Individualized Educational Video(s) 10/03/2022 1556 by Alver Fisher, RN Outcome: Adequate for Discharge 10/03/2022 1555 by Alver Fisher, RN Outcome: Progressing   Problem: Fluid Volume: Goal: Compliance with measures to maintain balanced fluid volume will improve 10/03/2022 1556 by Alver Fisher, RN Outcome: Adequate for Discharge 10/03/2022 1555 by Alver Fisher, RN Outcome: Progressing   Problem: Health Behavior/Discharge Planning: Goal: Ability to manage health-related needs will improve 10/03/2022 1556 by Alver Fisher, RN Outcome: Adequate for Discharge 10/03/2022 1555 by Alver Fisher, RN Outcome: Progressing   Problem: Nutritional: Goal: Ability to make healthy dietary choices will improve 10/03/2022 1556 by Alver Fisher, RN Outcome: Adequate for Discharge 10/03/2022 1555 by Alver Fisher, RN Outcome: Progressing

## 2022-10-03 NOTE — Progress Notes (Signed)
[facility]  ROUNDING NOTE   Subjective:   Family at bedside. Patient is Spanish speaking only. History taken with assistance of family.   Hemodialysis treatment last night. Tolerated treatment well. UF of 2 Liters.   Objective:  Vital signs in last 24 hours:  Temp:  [97.2 F (36.2 C)-98.9 F (37.2 C)] 97.2 F (36.2 C) (07/13 0809) Pulse Rate:  [68-86] 74 (07/13 0809) Resp:  [16-20] 16 (07/13 0809) BP: (101-160)/(53-81) 101/61 (07/13 0809) SpO2:  [95 %-98 %] 95 % (07/13 0809)  Weight change:  Filed Weights   09/29/22 0736 10/01/22 1615  Weight: 65.2 kg 61.6 kg    Intake/Output: I/O last 3 completed shifts: In: 120 [P.O.:120] Out: 4400 [Other:4400]   Intake/Output this shift:  No intake/output data recorded.  Physical Exam: General: NAD, laying in bed  Head: Normocephalic, atraumatic. Moist oral mucosal membranes  Eyes: Anicteric  Lungs:  Diminished bilaterally, room air  Heart: Regular rate and rhythm  Abdomen:  Soft, nontender  Extremities:  No peripheral edema.  Neurologic: Nonfocal, moving all four extremities  Skin: No lesions  Access: Lt AVF    Basic Metabolic Panel: Recent Labs  Lab 09/29/22 0738 09/30/22 0744 10/01/22 0419 10/02/22 0625 10/03/22 0606  NA 135 136 136 136 135  K 5.2* 4.2 4.0 3.5 3.1*  CL 99 98 97* 99 97*  CO2 25 26 25 26 28   GLUCOSE 93 99 89 77 79  BUN 28* 17 33* 22 13  CREATININE 7.51* 4.61* 5.82* 4.10* 2.51*  CALCIUM 8.8* 8.6* 8.7* 8.4* 7.9*  MG  --   --  2.2 2.0 1.7  PHOS  --  6.6*  --   --   --     Liver Function Tests: Recent Labs  Lab 09/29/22 0738 09/30/22 0744  AST 22  --   ALT 10  --   ALKPHOS 100  --   BILITOT 0.7  --   PROT 5.9*  --   ALBUMIN 2.6* 2.3*   Recent Labs  Lab 09/29/22 0738  LIPASE 23   No results for input(s): "AMMONIA" in the last 168 hours.  CBC: Recent Labs  Lab 09/29/22 0738 09/30/22 0744 10/01/22 0419 10/02/22 0625 10/03/22 0606  WBC 7.0 5.3 5.0 5.4 4.7  HGB  10.7* 10.0* 11.0* 11.1* 11.6*  HCT 33.9* 30.5* 34.2* 33.3* 35.8*  MCV 88.3 85.2 85.7 83.7 84.2  PLT 166 131* 178 163 156    Cardiac Enzymes: No results for input(s): "CKTOTAL", "CKMB", "CKMBINDEX", "TROPONINI" in the last 168 hours.  BNP: Invalid input(s): "POCBNP"  CBG: Recent Labs  Lab 09/29/22 1446  GLUCAP 98    Microbiology: Results for orders placed or performed during the hospital encounter of 09/29/22  Resp panel by RT-PCR (RSV, Flu A&B, Covid) Anterior Nasal Swab     Status: Abnormal   Collection Time: 09/29/22  9:40 AM   Specimen: Anterior Nasal Swab  Result Value Ref Range Status   SARS Coronavirus 2 by RT PCR POSITIVE (A) NEGATIVE Final    Comment: (NOTE) SARS-CoV-2 target nucleic acids are DETECTED.  The SARS-CoV-2 RNA is generally detectable in upper respiratory specimens during the acute phase of infection. Positive results are indicative of the presence of the identified virus, but do not rule out bacterial infection or co-infection with other pathogens not detected by the test. Clinical correlation with patient history and other diagnostic information is necessary to determine patient infection status. The expected result is Negative.  Fact Sheet for Patients: BloggerCourse.com  Fact Sheet for Healthcare Providers: SeriousBroker.it  This test is not yet approved or cleared by the [place] FDA and  has been authorized for detection and/or diagnosis of SARS-CoV-2 by FDA under an Emergency Use Authorization (EUA).  This EUA will remain in effect (meaning this test can be used) for the duration of  the COVID-19 declaration under Section 564(b)(1) of the A ct, 21 U.S.C. section 360bbb-3(b)(1), unless the authorization is terminated or revoked sooner.     Influenza A by PCR NEGATIVE NEGATIVE Final   Influenza B by PCR NEGATIVE NEGATIVE Final    Comment: (NOTE) The Xpert Xpress SARS-CoV-2/FLU/RSV  plus assay is intended as an aid in the diagnosis of influenza from Nasopharyngeal swab specimens and should not be used as a sole basis for treatment. Nasal washings and aspirates are unacceptable for Xpert Xpress SARS-CoV-2/FLU/RSV testing.  Fact Sheet for Patients: BloggerCourse.com  Fact Sheet for Healthcare Providers: SeriousBroker.it  This test is not yet approved or cleared by the [place] FDA and has been authorized for detection and/or diagnosis of SARS-CoV-2 by FDA under an Emergency Use Authorization (EUA). This EUA will remain in effect (meaning this test can be used) for the duration of the COVID-19 declaration under Section 564(b)(1) of the Act, 21 U.S.C. section 360bbb-3(b)(1), unless the authorization is terminated or revoked.     Resp Syncytial Virus by PCR NEGATIVE NEGATIVE Final    Comment: (NOTE) Fact Sheet for Patients: BloggerCourse.com  Fact Sheet for Healthcare Providers: SeriousBroker.it  This test is not yet approved or cleared by the [place] FDA and has been authorized for detection and/or diagnosis of SARS-CoV-2 by FDA under an Emergency Use Authorization (EUA). This EUA will remain in effect (meaning this test can be used) for the duration of the COVID-19 declaration under Section 564(b)(1) of the Act, 21 U.S.C. section 360bbb-3(b)(1), unless the authorization is terminated or revoked.  Performed at [facility], [address] [place], [place] [address]   MRSA Next Gen by PCR, Nasal     Status: None   Collection Time: 09/29/22  2:56 PM   Specimen: Nasal Mucosa; Nasal Swab  Result Value Ref Range Status   MRSA by PCR Next Gen NOT DETECTED NOT DETECTED Final    Comment: (NOTE) The GeneXpert MRSA Assay (FDA approved for NASAL specimens only), is one component of a comprehensive MRSA colonization surveillance program. It is  not intended to diagnose MRSA infection nor to guide or monitor treatment for MRSA infections. Test performance is not FDA approved in patients less than 16 years old. Performed at [facility], [address] [place], [place] [address]   Culture, blood (Routine X 2) w Reflex to ID Panel     Status: None (Preliminary result)   Collection Time: 09/29/22  3:16 PM   Specimen: BLOOD  Result Value Ref Range Status   Specimen Description BLOOD RAC  Final   Special Requests   Final    BOTTLES DRAWN AEROBIC ONLY Blood Culture results may not be optimal due to an inadequate volume of blood received in culture bottles   Culture   Final    NO GROWTH 4 DAYS Performed at [facility], [address], [place], [place] [address]    Report Status PENDING  Incomplete  Culture, blood (Routine X 2) w Reflex to ID Panel     Status: None (Preliminary result)   Collection Time: 09/29/22  3:17 PM   Specimen: BLOOD  Result Value Ref Range  Status   Specimen Description BLOOD BLOOD RIGHT ARM RFOA  Final   Special Requests   Final    BOTTLES DRAWN AEROBIC ONLY Blood Culture adequate volume   Culture   Final    NO GROWTH 4 DAYS Performed at [facility], [address] [place], [place] [address]    Report Status PENDING  Incomplete  Expectorated Sputum Assessment w Gram Stain, Rflx to Resp Cult     Status: None   Collection Time: 10/01/22  8:35 AM   Specimen: Sputum  Result Value Ref Range Status   Specimen Description SPUTUM  Final   Special Requests NONE  Final   Sputum evaluation   Final    THIS SPECIMEN IS ACCEPTABLE FOR SPUTUM CULTURE Performed at [facility], [address] [place], [place] [address]    Report Status 10/01/2022 FINAL  Final  Culture, Respiratory w Gram Stain     Status: None (Preliminary result)   Collection Time: 10/01/22  8:35 AM   Specimen: SPU  Result Value Ref Range Status   Specimen Description   Final    SPUTUM Performed at  [facility], [address] [place], [place] [address]    Special Requests   Final    NONE Reflexed from [id number] Performed at [facility], [address] [place], [place] [address]    Gram Stain NO WBC SEEN NO ORGANISMS SEEN   Final   Culture   Final    CULTURE REINCUBATED FOR BETTER GROWTH Performed at [facility], [address] [place], [place] [address]    Report Status PENDING  Incomplete    Coagulation Studies: No results for input(s): "LABPROT", "INR" in the last 72 hours.  Urinalysis: No results for input(s): "COLORURINE", "LABSPEC", "PHURINE", "GLUCOSEU", "HGBUR", "BILIRUBINUR", "KETONESUR", "PROTEINUR", "UROBILINOGEN", "NITRITE", "LEUKOCYTESUR" in the last 72 hours.  Invalid input(s): "APPERANCEUR"    Imaging: No results found.   Medications:    anticoagulant sodium citrate     cefTRIAXone (ROCEPHIN)  IV 1 g (10/02/22 1842)    amLODipine  5 mg Oral Daily   ascorbic acid  500 mg Oral Daily   azithromycin  500 mg Oral Q24H   Chlorhexidine Gluconate Cloth  6 each Topical Q0600   cinacalcet  30 mg Oral Q breakfast   dexamethasone  6 mg Oral Daily   ferrous sulfate  325 mg Oral Q breakfast   guaiFENesin  600 mg Oral BID   heparin injection (subcutaneous)  5,000 Units Subcutaneous Q8H   Ipratropium-Albuterol  1 puff Inhalation QID   pravastatin  20 mg Oral q1800   zinc sulfate  220 mg Oral Daily   alteplase, anticoagulant sodium citrate, chlorpheniramine-HYDROcodone, heparin, ipratropium-albuterol, lidocaine (PF), lidocaine-prilocaine, pentafluoroprop-tetrafluoroeth  Assessment/ Plan:  Ms. Betty Silva is a 77 y.o.  female  with past medical history of hypertension, hyperlipidemia, and end stage renal disease on hemodialysis, who was admitted to [facility] on 09/29/2022 for Pleural effusion [J90] Acute respiratory failure with hypoxia (HCC) [J96.01] ESRD on hemodialysis (HCC) [N18.6, Z99.2] Pneumonia due to infectious organism,  unspecified laterality, unspecified part of lung [J18.9] COVID-19 [U07.1]   End stage renal disease on hemodialysis. Hemodialysis treatment completed early this morning. Tolerated treatment well.  Continue TTS schedule.   2. Acute respiratory failure due to fluid overload and COVID-19 infection.  Continue supportive care, empiric antibiotics and stress dose steroids.   3. Anemia of chronic kidney disease Lab Results  Component Value Date   HGB  11.6 (L) 10/03/2022  No indication for ESA at this time.   4. Secondary Hyperparathyroidism:   - cinacalcet.   5. Hypertension with chronic kidney disease:   - amlodipine    LOS: 4 Betty Silva 7/13/20241:05 PM

## 2022-10-03 NOTE — Plan of Care (Signed)
  Problem: Education: Goal: Knowledge of risk factors and measures for prevention of condition will improve Outcome: Progressing   Problem: Coping: Goal: Psychosocial and spiritual needs will be supported Outcome: Progressing   Problem: Respiratory: Goal: Will maintain a patent airway Outcome: Progressing Goal: Complications related to the disease process, condition or treatment will be avoided or minimized Outcome: Progressing   Problem: Education: Goal: Knowledge of General Education information will improve Description: Including pain rating scale, medication(s)/side effects and non-pharmacologic comfort measures Outcome: Progressing   Problem: Health Behavior/Discharge Planning: Goal: Ability to manage health-related needs will improve Outcome: Progressing   Problem: Clinical Measurements: Goal: Ability to maintain clinical measurements within normal limits will improve Outcome: Progressing Goal: Will remain free from infection Outcome: Progressing Goal: Diagnostic test results will improve Outcome: Progressing Goal: Respiratory complications will improve Outcome: Progressing Goal: Cardiovascular complication will be avoided Outcome: Progressing   Problem: Activity: Goal: Risk for activity intolerance will decrease Outcome: Progressing   Problem: Nutrition: Goal: Adequate nutrition will be maintained Outcome: Progressing   Problem: Coping: Goal: Level of anxiety will decrease Outcome: Progressing   Problem: Elimination: Goal: Will not experience complications related to bowel motility Outcome: Progressing Goal: Will not experience complications related to urinary retention Outcome: Progressing   Problem: Pain Managment: Goal: General experience of comfort will improve Outcome: Progressing   Problem: Safety: Goal: Ability to remain free from injury will improve Outcome: Progressing   Problem: Skin Integrity: Goal: Risk for impaired skin integrity will  decrease Outcome: Progressing   Problem: Education: Goal: Knowledge of disease and its progression will improve Outcome: Progressing Goal: Individualized Educational Video(s) Outcome: Progressing   Problem: Fluid Volume: Goal: Compliance with measures to maintain balanced fluid volume will improve Outcome: Progressing   Problem: Health Behavior/Discharge Planning: Goal: Ability to manage health-related needs will improve Outcome: Progressing   Problem: Nutritional: Goal: Ability to make healthy dietary choices will improve Outcome: Progressing   Problem: Clinical Measurements: Goal: Complications related to the disease process, condition or treatment will be avoided or minimized Outcome: Progressing   

## 2022-10-03 NOTE — Progress Notes (Signed)
Received patient in bed  Alert and oriented to self Informed consent signed and in chart.   TX duration:3 hours  Patient tolerated well.  Alert, without acute distress.  Hand-off given to patient's nurse.   Access used: AVF Access issues: none  Total UF removed: 2000 Medication(s) given: none Post HD VS: see table below Post HD weight: unable to be obtain    10/03/22 0500  Vitals  Temp 98.3 F (36.8 C)  Temp Source Oral  BP (!) 157/54  MAP (mmHg) 84  BP Location Right Wrist  BP Method Automatic  Patient Position (if appropriate) Lying  Pulse Rate 70  Pulse Rate Source Dinamap  Resp 18  Oxygen Therapy  SpO2 97 %  O2 Device Room Air  Patient Activity (if Appropriate) In bed  Pulse Oximetry Type Continuous  During Treatment Monitoring  HD Safety Checks Performed Yes  Intra-Hemodialysis Comments Tolerated well  Post Treatment  Dialyzer Clearance Heavily streaked  Duration of HD Treatment -hour(s) 3 hour(s)  Hemodialysis Intake (mL) 0 mL  Liters Processed 72  Fluid Removed (mL) 2000 mL  Tolerated HD Treatment Yes  Post-Hemodialysis Comments goal met  AVG/AVF Arterial Site Held (minutes) 6 minutes  AVG/AVF Venous Site Held (minutes) 4 minutes  Fistula / Graft Left Upper arm  Placement Date/Time: 09/29/22 (c) 1500   Placed prior to admission: Yes  Orientation: Left  Access Location: Upper arm  Site Condition No complications  Fistula / Graft Assessment Present;Thrill;Bruit;Aneurysm present  Status Deaccessed;Flushed;Patent      [facility]

## 2022-10-03 NOTE — Progress Notes (Signed)
Pt had a difficult discharge due to fact that there meds were not all available and the family thought that the patient needed to have HD today. Pt had dialysis early in the morning around 4 am or 5 am today. Pt hopefully will resume HD at [facility] as before. MD was made aware of discharge issues related to meds and HD. Pt will pick up remaining medications from [facility] on Monday. The pharmacist was bilingual in Spanish & English  bought the antibiotics & 3 steroids tabs to room.  Nurse informed patient after talking to several of her family members that her outside hemodialysis location is at [facility] and that is where she should go on Tuesday. Pt and son were told to pick up remaining medications on Monday at [facility] as listed in the discharge paperwork MD was made aware of difficult discharge and called to confirm all information given by nurse and pharmacist.  Pt was taken by wheelchair with her son downstairs to front of hospital. Pt was alert and oriented x 4 and denied any pain. No signs nor symptoms of distress noted upon discharge.

## 2022-10-03 NOTE — Discharge Summary (Signed)
Physician Discharge Summary  Josephine Lafrance MWU:[id number] DOB: 1946/03/18 DOA: 09/29/2022  PCP: [facility]  Admit date: 09/29/2022  Discharge date: 10/03/2022  Admitted From: Home.  Disposition:  Home.  Recommendations for Outpatient Follow-up:  Follow up with PCP in 1-2 weeks. Please obtain BMP/CBC in one week. Advised to take Omnicef 300 mg daily for 3 more days. Advised to take Decadron 6 mg daily for 3 more days. Advised to continue hemodialysis as per schedule.  Home Health:None Equipment/Devices:None  Discharge Condition: Good CODE STATUS:Full code Diet recommendation: Heart Healthy   Brief [facility] Course: This 77 yrs old female with medical history significant of end-stage renal disease on hemodialysis Tuesday Thursday Saturday, hypertension, remote history of PE presenting with acute respiratory failure with hypoxia, She is found to have COVID-19, multiloculated pneumonia.  Patient was started on remdesivir, Decadron and IV antibiotics.  Patient was initially requiring 2 L of supplemental oxygen, She had made significant improvement during the hospitalization.  She is successfully weaned down to room air.  Patient was given remdesivir for 3 days.  Patient continued on hemodialysis as per schedule.  Patient feels better and wants to be discharged.  Patient is being discharged home on Omnicef 300 mg daily for 3 more days, Decadron 6 mg for 3 more days.  Advised patient to follow-up with nephrology for continuation of hemodialysis as per schedule.  Patient is being discharged home.  Discharge Diagnoses:  Principal Problem:   Acute respiratory failure with hypoxia (HCC) Active Problems:   PNA (pneumonia)   COVID-19   ESRD (end stage renal disease) (HCC)   Hyperlipidemia   Hypertension  Acute hypoxic respiratory failure: Patient was hypoxic on presentation requiring 2 L nasal cannula with noted cough, shortness of breath, malaise for  multiple days  CTA chest with noted right lower lobe multiloculated pneumonia Patient also COVID-positive. Continue Decadron, nebulized bronchodilators and remdesivir. Continue empiric antibiotics ceftriaxone and Zithromax. Continue supplemental oxygen and wean as tolerated. Patient completed remdesivir for 3 days.  Successfully weaned down to room air.   PNA (pneumonia) Loculated trace pleural effusions: Imaging concerning for superimposed infectious process. -PCCM discussed with IR, currently no need for pigtail cath.   Started on Linezolid by PCCM, d/c'ed since MRSA neg.   Plan: Continue azithro and ceftriaxone x 5 days. Patient being discharged on Omnicef 300 mg daily for 3 more days.   COVID-19: --She was started on IV Decadron and Remdesivir. --Continue Remdesivir --SwitchED to oral Decadron. --Continue Combivent QID --Continue Mucinex   Hypertension --Continue amlodipine   Hyperlipidemia Continue statin   ESRD on HD TTS Continue Hemodialysis as per Nephro  Discharge Instructions  Discharge Instructions     Call MD for:  difficulty breathing, headache or visual disturbances   Complete by: As directed    Call MD for:  persistant dizziness or light-headedness   Complete by: As directed    Call MD for:  persistant nausea and vomiting   Complete by: As directed    Diet - low sodium heart healthy   Complete by: As directed    Diet Carb Modified   Complete by: As directed    Discharge instructions   Complete by: As directed    Advised to follow-up with primary care physician in 1 week. Advised to take Omnicef 300 mg daily for 3 more days. Advised to take Decadron 6 mg daily for 3 more days. Advised to continue hemodialysis as per schedule.   Increase activity slowly  Complete by: As directed    No wound care   Complete by: As directed       Allergies as of 10/03/2022   No Known Allergies      Medication List     TAKE these medications     amLODipine 5 MG tablet Commonly known as: NORVASC Take 1 tablet by mouth daily.   cefdinir 300 MG capsule Commonly known as: OMNICEF Tome 1 cpsula (300 mg en total) por va oral diariamente. (Take 1 capsule (300 mg total) by mouth daily.)   dexamethasone 6 MG tablet Commonly known as: DECADRON Take 1 tablet (6 mg total) by mouth daily for 3 days.   ferrous sulfate 325 (65 FE) MG tablet Tome 1 tableta (325 mg en total) por va oral diariamente con el desayuno. (Take 1 tablet (325 mg total) by mouth daily with breakfast.) Start taking on: October 04, 2022   pravastatin 20 MG tablet Commonly known as: PRAVACHOL Take 1 tablet (20 mg total) by mouth daily at 6 PM.        Follow-up Information     [facility] Follow up in 1 week(s).   Contact information: [address] [place] [address] [phone]                No Known Allergies  Consultations: None   Procedures/Studies: DG Chest Port 1 View  Result Date: 09/30/2022 CLINICAL DATA:  77 year old female with history of pleural effusion. EXAM: PORTABLE CHEST 1 VIEW COMPARISON:  Chest x-ray 09/29/2022. FINDINGS: Small right and moderate left pleural effusions, enlarging on the left compared to the prior study. Patchy opacities throughout the mid to lower lungs bilaterally likely reflect a combination of atelectasis and consolidation, worsened compared to the prior examination. No appreciable pneumothorax. No evidence of pulmonary edema. Heart size appears mildly enlarged. The patient is rotated to the right on today's exam, resulting in distortion of the mediastinal contours and reduced diagnostic sensitivity and specificity for mediastinal pathology. Atherosclerotic calcifications are noted in the thoracic aorta. Multiple prominent mildly dilated loops of small bowel are noted in the left upper quadrant of the abdomen measuring up to 3.5 cm in diameter. IMPRESSION: 1. Worsening aeration in the lungs  bilaterally likely reflective of increasing areas of atelectasis/consolidation, along with increasing small right and moderate left pleural effusions. 2. Mild cardiomegaly. 3. Aortic atherosclerosis. 4. Dilated loops of small bowel in the left upper quadrant of the abdomen. Correlation with abdominal radiograph is suggested if there is any clinical concern for small bowel obstruction or ileus. Electronically Signed   By: Trudie Reed M.D.   On: 09/30/2022 05:59   CT Angio Chest PE W/Cm &/Or Wo Cm  Result Date: 09/29/2022 CLINICAL DATA:  Pulmonary embolus suspected EXAM: CT ANGIOGRAPHY CHEST WITH CONTRAST TECHNIQUE: Multidetector CT imaging of the chest was performed using the standard protocol during bolus administration of intravenous contrast. Multiplanar CT image reconstructions and MIPs were obtained to evaluate the vascular anatomy. RADIATION DOSE REDUCTION: This exam was performed according to the departmental dose-optimization program which includes automated exposure control, adjustment of the mA and/or kV according to patient size and/or use of iterative reconstruction technique. CONTRAST:  75mL OMNIPAQUE IOHEXOL 350 MG/ML SOLN COMPARISON:  Chest CT dated November 11th 2011 FINDINGS: Cardiovascular: Ill-defined filling defects in the right lower lobe segmental/subsegmental pulmonary arteries, likely due to slow flow given associated severe chronic lung disease at that location. No evidence acute pulmonary embolus. Mild cardiomegaly. No pericardial effusion. Normal caliber  thoracic aorta with moderate atherosclerotic disease. Mild coronary artery calcifications. 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 Collateral flow via chest wall, right diaphragmatic and azygous veins. Mediastinum/Nodes: Esophagus and thyroid are unremarkable. No enlarged nodes seen in the chest. Lungs/Pleura: Central airways are patent. Mild bilateral bronchial wall  thickening. Focal multiloculated cystic lesion of the right lower lobe, with slightly increased wall thickening and new air-fluid level seen on series 5, image 93. Loculated left-greater-than-right trace bilateral pleural effusions with loculated fissural fluid of the right lung. Bibasilar atelectasis. Upper Abdomen: No acute abnormality. Musculoskeletal: Body wall edema. No acute or significant osseous findings. Review of the MIP images confirms the above findings. IMPRESSION: 1. Ill-defined filling defects in the right lower lobe segmental/subsegmental pulmonary arteries, likely due to slow flow given associated chronic lung disease of the right lower lobe. No evidence acute pulmonary embolus. 2. 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 3. Loculated trace left-greater-than-right pleural effusions with right fissural fluid. 4. Multiloculated cystic lesion of the right lower lobe with slightly increased wall thickening and new air-fluid level, concerning for superimposed infection. 5. Mild bilateral bronchial wall thickening, findings can be seen in the setting of bronchitis. 6.  Aortic Atherosclerosis (ICD10-I70.0). Electronically Signed   By: Allegra Lai M.D.   On: 09/29/2022 10:48   DG Chest 2 View  Result Date: 09/29/2022 CLINICAL DATA:  Chest pain EXAM: CHEST - 2 VIEW COMPARISON:  Chest x-ray dated April 18, 2017 FINDINGS: Cardiac and mediastinal contours are within normal limits. Chronic left lower lobe bronchiectasis. Increased patchy opacities of the bilateral lower lobes and right upper lobe. New small left-greater-than-right pleural effusions. IMPRESSION: 1. Increased patchy opacities of the bilateral lower lobes and right upper lobe. Recommend follow-up PA and lateral chest radiograph in 6-8 weeks to ensure resolution. 2. New small left-greater-than-right pleural effusions. Electronically Signed   By: Allegra Lai M.D.   On: 09/29/2022 08:24     Subjective: Patient was seen and examined at bedside.  Overnight events noted.   Patient reports doing much better. She  wants to be discharged. Spanish interpreter [place] # [phone] used.  All questions answered.  Patient is being discharged home.  Discharge Exam: Vitals:   10/03/22 0611 10/03/22 0809  BP: (!) 153/55 101/61  Pulse: 68 74  Resp: 18 16  Temp: 98.4 F (36.9 C) (!) 97.2 F (36.2 C)  SpO2: 98% 95%   Vitals:   10/03/22 0449 10/03/22 0500 10/03/22 0611 10/03/22 0809  BP: (!) 149/55 (!) 157/54 (!) 153/55 101/61  Pulse: 70 70 68 74  Resp: 18 18 18 16   Temp:  98.3 F (36.8 C) 98.4 F (36.9 C) (!) 97.2 F (36.2 C)  TempSrc:  Oral Oral   SpO2: 96% 97% 98% 95%  Weight:      Height:        General: Pt is alert, awake, not in acute distress Cardiovascular: RRR, S1/S2 +, no rubs, no gallops Respiratory: CTA bilaterally, no wheezing, no rhonchi Abdominal: Soft, NT, ND, bowel sounds + Extremities: no edema, no cyanosis    The results of significant diagnostics from this hospitalization (including imaging, microbiology, ancillary and laboratory) are listed below for reference.     Microbiology: Recent Results (from the past 240 hour(s))  Resp panel by RT-PCR (RSV, Flu A&B, Covid) Anterior Nasal Swab     Status: Abnormal   Collection Time: 09/29/22  9:40 AM   Specimen: Anterior Nasal Swab  Result Value Ref Range Status   SARS Coronavirus 2 by RT PCR POSITIVE (A) NEGATIVE Final    Comment: (NOTE) SARS-CoV-2 target nucleic acids are DETECTED.  The SARS-CoV-2 RNA is generally detectable in upper respiratory specimens during the acute phase of infection. Positive results are indicative of the presence of the identified virus, but do not rule out bacterial infection or co-infection with other pathogens not detected by the test. Clinical correlation with patient history and other diagnostic information is necessary to  determine patient infection status. The expected result is Negative.  Fact Sheet for Patients: BloggerCourse.com  Fact Sheet for Healthcare Providers: SeriousBroker.it  This test is not yet approved or cleared by the [place] FDA and  has been authorized for detection and/or diagnosis of SARS-CoV-2 by FDA under an Emergency Use Authorization (EUA).  This EUA will remain in effect (meaning this test can be used) for the duration of  the COVID-19 declaration under Section 564(b)(1) of the A ct, 21 U.S.C. section 360bbb-3(b)(1), unless the authorization is terminated or revoked sooner.     Influenza A by PCR NEGATIVE NEGATIVE Final   Influenza B by PCR NEGATIVE NEGATIVE Final    Comment: (NOTE) The Xpert Xpress SARS-CoV-2/FLU/RSV plus assay is intended as an aid in the diagnosis of influenza from Nasopharyngeal swab specimens and should not be used as a sole basis for treatment. Nasal washings and aspirates are unacceptable for Xpert Xpress SARS-CoV-2/FLU/RSV testing.  Fact Sheet for Patients: BloggerCourse.com  Fact Sheet for Healthcare Providers: SeriousBroker.it  This test is not yet approved or cleared by the [place] FDA and has been authorized for detection and/or diagnosis of SARS-CoV-2 by FDA under an Emergency Use Authorization (EUA). This EUA will remain in effect (meaning this test can be used) for the duration of the COVID-19 declaration under Section 564(b)(1) of the Act, 21 U.S.C. section 360bbb-3(b)(1), unless the authorization is terminated or revoked.     Resp Syncytial Virus by PCR NEGATIVE NEGATIVE Final    Comment: (NOTE) Fact Sheet for Patients: BloggerCourse.com  Fact Sheet for Healthcare Providers: SeriousBroker.it  This test is not yet approved or cleared by the [place] FDA and has  been authorized for detection and/or diagnosis of SARS-CoV-2 by FDA under an Emergency Use Authorization (EUA). This EUA will remain in effect (meaning this test can be used) for the duration of the COVID-19 declaration under Section 564(b)(1) of the Act, 21 U.S.C. section 360bbb-3(b)(1), unless the authorization is terminated or revoked.  Performed at [facility], [address] [place], [place] [address]   MRSA Next Gen by PCR, Nasal     Status: None   Collection Time: 09/29/22  2:56 PM   Specimen: Nasal Mucosa; Nasal Swab  Result Value Ref Range Status   MRSA by PCR Next Gen NOT DETECTED NOT DETECTED Final    Comment: (NOTE) The GeneXpert MRSA Assay (FDA approved for NASAL specimens only), is one component of a comprehensive MRSA colonization surveillance program. It is not intended to diagnose MRSA infection nor to guide or monitor treatment for MRSA infections. Test performance is not FDA approved in patients less than 3 years old. Performed at [facility], [address] [place], [place] [address]   Culture, blood (Routine X 2) w Reflex to ID Panel     Status: None (Preliminary result)   Collection Time: 09/29/22  3:16 PM   Specimen: BLOOD  Result Value Ref Range  Status   Specimen Description BLOOD RAC  Final   Special Requests   Final    BOTTLES DRAWN AEROBIC ONLY Blood Culture results may not be optimal due to an inadequate volume of blood received in culture bottles   Culture   Final    NO GROWTH 4 DAYS Performed at [facility], [address] [place], [place] [address]    Report Status PENDING  Incomplete  Culture, blood (Routine X 2) w Reflex to ID Panel     Status: None (Preliminary result)   Collection Time: 09/29/22  3:17 PM   Specimen: BLOOD  Result Value Ref Range Status   Specimen Description BLOOD BLOOD RIGHT ARM RFOA  Final   Special Requests   Final    BOTTLES DRAWN AEROBIC ONLY Blood Culture adequate volume   Culture    Final    NO GROWTH 4 DAYS Performed at [facility], [address], [place], [place] [address]    Report Status PENDING  Incomplete  Expectorated Sputum Assessment w Gram Stain, Rflx to Resp Cult     Status: None   Collection Time: 10/01/22  8:35 AM   Specimen: Sputum  Result Value Ref Range Status   Specimen Description SPUTUM  Final   Special Requests NONE  Final   Sputum evaluation   Final    THIS SPECIMEN IS ACCEPTABLE FOR SPUTUM CULTURE Performed at [facility], [address] [place], [place] [address]    Report Status 10/01/2022 FINAL  Final  Culture, Respiratory w Gram Stain     Status: None (Preliminary result)   Collection Time: 10/01/22  8:35 AM   Specimen: SPU  Result Value Ref Range Status   Specimen Description   Final    SPUTUM Performed at [facility], [address], [place], [place] [address]    Special Requests   Final    NONE Reflexed from [id number] Performed at [facility], [address] [place], [place] [address]    Gram Stain NO WBC SEEN NO ORGANISMS SEEN   Final   Culture   Final    CULTURE REINCUBATED FOR BETTER GROWTH Performed at [facility], [address], [place], [place] [address]    Report Status PENDING  Incomplete     Labs: BNP (last 3 results) Recent Labs    09/29/22 0738  BNP 4,176.9*   Basic Metabolic Panel: Recent Labs  Lab 09/29/22 0738 09/30/22 0744 10/01/22 0419 10/02/22 0625 10/03/22 0606  NA 135 136 136 136 135  K 5.2* 4.2 4.0 3.5 3.1*  CL 99 98 97* 99 97*  CO2 25 26 25 26 28   GLUCOSE 93 99 89 77 79  BUN 28* 17 33* 22 13  CREATININE 7.51* 4.61* 5.82* 4.10* 2.51*  CALCIUM 8.8* 8.6* 8.7* 8.4* 7.9*  MG  --   --  2.2 2.0 1.7  PHOS  --  6.6*  --   --   --    Liver Function Tests: Recent Labs  Lab 09/29/22 0738 09/30/22 0744  AST 22  --   ALT 10  --   ALKPHOS 100  --   BILITOT 0.7  --   PROT 5.9*  --   ALBUMIN 2.6* 2.3*   Recent Labs  Lab  09/29/22 0738  LIPASE 23   No results for input(s): "AMMONIA" in the last 168 hours. CBC: Recent Labs  Lab 09/29/22 0738 09/30/22 0744 10/01/22 0419 10/02/22 0625 10/03/22 0606  WBC  7.0 5.3 5.0 5.4 4.7  HGB 10.7* 10.0* 11.0* 11.1* 11.6*  HCT 33.9* 30.5* 34.2* 33.3* 35.8*  MCV 88.3 85.2 85.7 83.7 84.2  PLT 166 131* 178 163 156   Cardiac Enzymes: No results for input(s): "CKTOTAL", "CKMB", "CKMBINDEX", "TROPONINI" in the last 168 hours. BNP: Invalid input(s): "POCBNP" CBG: Recent Labs  Lab 09/29/22 1446  GLUCAP 98   D-Dimer No results for input(s): "DDIMER" in the last 72 hours. Hgb A1c No results for input(s): "HGBA1C" in the last 72 hours. Lipid Profile No results for input(s): "CHOL", "HDL", "LDLCALC", "TRIG", "CHOLHDL", "LDLDIRECT" in the last 72 hours. Thyroid function studies No results for input(s): "TSH", "T4TOTAL", "T3FREE", "THYROIDAB" in the last 72 hours.  Invalid input(s): "FREET3" Anemia work up No results for input(s): "VITAMINB12", "FOLATE", "FERRITIN", "TIBC", "IRON", "RETICCTPCT" in the last 72 hours. Urinalysis    Component Value Date/Time   COLORURINE YELLOW (A) 04/18/2017 1706   APPEARANCEUR CLOUDY (A) 04/18/2017 1706   APPEARANCEUR Hazy 06/02/2013 1850   LABSPEC 1.019 04/18/2017 1706   LABSPEC 1.017 06/02/2013 1850   PHURINE 5.0 04/18/2017 1706   GLUCOSEU NEGATIVE 04/18/2017 1706   GLUCOSEU Negative 06/02/2013 1850   HGBUR SMALL (A) 04/18/2017 1706   BILIRUBINUR NEGATIVE 04/18/2017 1706   BILIRUBINUR Negative 06/02/2013 1850   KETONESUR NEGATIVE 04/18/2017 1706   PROTEINUR 100 (A) 04/18/2017 1706   NITRITE NEGATIVE 04/18/2017 1706   LEUKOCYTESUR NEGATIVE 04/18/2017 1706   LEUKOCYTESUR 1+ 06/02/2013 1850   Sepsis Labs Recent Labs  Lab 09/30/22 0744 10/01/22 0419 10/02/22 0625 10/03/22 0606  WBC 5.3 5.0 5.4 4.7   Microbiology Recent Results (from the past 240 hour(s))  Resp panel by RT-PCR (RSV, Flu A&B, Covid) Anterior Nasal  Swab     Status: Abnormal   Collection Time: 09/29/22  9:40 AM   Specimen: Anterior Nasal Swab  Result Value Ref Range Status   SARS Coronavirus 2 by RT PCR POSITIVE (A) NEGATIVE Final    Comment: (NOTE) SARS-CoV-2 target nucleic acids are DETECTED.  The SARS-CoV-2 RNA is generally detectable in upper respiratory specimens during the acute phase of infection. Positive results are indicative of the presence of the identified virus, but do not rule out bacterial infection or co-infection with other pathogens not detected by the test. Clinical correlation with patient history and other diagnostic information is necessary to determine patient infection status. The expected result is Negative.  Fact Sheet for Patients: BloggerCourse.com  Fact Sheet for Healthcare Providers: SeriousBroker.it  This test is not yet approved or cleared by the [place] FDA and  has been authorized for detection and/or diagnosis of SARS-CoV-2 by FDA under an Emergency Use Authorization (EUA).  This EUA will remain in effect (meaning this test can be used) for the duration of  the COVID-19 declaration under Section 564(b)(1) of the A ct, 21 U.S.C. section 360bbb-3(b)(1), unless the authorization is terminated or revoked sooner.     Influenza A by PCR NEGATIVE NEGATIVE Final   Influenza B by PCR NEGATIVE NEGATIVE Final    Comment: (NOTE) The Xpert Xpress SARS-CoV-2/FLU/RSV plus assay is intended as an aid in the diagnosis of influenza from Nasopharyngeal swab specimens and should not be used as a sole basis for treatment. Nasal washings and aspirates are unacceptable for Xpert Xpress SARS-CoV-2/FLU/RSV testing.  Fact Sheet for Patients: BloggerCourse.com  Fact Sheet for Healthcare Providers: SeriousBroker.it  This test is not yet approved or cleared by the [place] FDA and has been authorized  for detection and/or diagnosis  of SARS-CoV-2 by FDA under an Emergency Use Authorization (EUA). This EUA will remain in effect (meaning this test can be used) for the duration of the COVID-19 declaration under Section 564(b)(1) of the Act, 21 U.S.C. section 360bbb-3(b)(1), unless the authorization is terminated or revoked.     Resp Syncytial Virus by PCR NEGATIVE NEGATIVE Final    Comment: (NOTE) Fact Sheet for Patients: BloggerCourse.com  Fact Sheet for Healthcare Providers: SeriousBroker.it  This test is not yet approved or cleared by the [place] FDA and has been authorized for detection and/or diagnosis of SARS-CoV-2 by FDA under an Emergency Use Authorization (EUA). This EUA will remain in effect (meaning this test can be used) for the duration of the COVID-19 declaration under Section 564(b)(1) of the Act, 21 U.S.C. section 360bbb-3(b)(1), unless the authorization is terminated or revoked.  Performed at [facility], [address] [place], [place] [address]   MRSA Next Gen by PCR, Nasal     Status: None   Collection Time: 09/29/22  2:56 PM   Specimen: Nasal Mucosa; Nasal Swab  Result Value Ref Range Status   MRSA by PCR Next Gen NOT DETECTED NOT DETECTED Final    Comment: (NOTE) The GeneXpert MRSA Assay (FDA approved for NASAL specimens only), is one component of a comprehensive MRSA colonization surveillance program. It is not intended to diagnose MRSA infection nor to guide or monitor treatment for MRSA infections. Test performance is not FDA approved in patients less than 48 years old. Performed at [facility], [address] [place], [place] [address]   Culture, blood (Routine X 2) w Reflex to ID Panel     Status: None (Preliminary result)   Collection Time: 09/29/22  3:16 PM   Specimen: BLOOD  Result Value Ref Range Status   Specimen Description BLOOD RAC  Final   Special Requests    Final    BOTTLES DRAWN AEROBIC ONLY Blood Culture results may not be optimal due to an inadequate volume of blood received in culture bottles   Culture   Final    NO GROWTH 4 DAYS Performed at [facility] [address] [place], [place] [address]    Report Status PENDING  Incomplete  Culture, blood (Routine X 2) w Reflex to ID Panel     Status: None (Preliminary result)   Collection Time: 09/29/22  3:17 PM   Specimen: BLOOD  Result Value Ref Range Status   Specimen Description BLOOD BLOOD RIGHT ARM RFOA  Final   Special Requests   Final    BOTTLES DRAWN AEROBIC ONLY Blood Culture adequate volume   Culture   Final    NO GROWTH 4 DAYS Performed at [facility], [address] [place], [place] [address]    Report Status PENDING  Incomplete  Expectorated Sputum Assessment w Gram Stain, Rflx to Resp Cult     Status: None   Collection Time: 10/01/22  8:35 AM   Specimen: Sputum  Result Value Ref Range Status   Specimen Description SPUTUM  Final   Special Requests NONE  Final   Sputum evaluation   Final    THIS SPECIMEN IS ACCEPTABLE FOR SPUTUM CULTURE Performed at [facility], [address] [place], [place] [address]    Report Status 10/01/2022 FINAL  Final  Culture, Respiratory w Gram Stain     Status: None (Preliminary result)   Collection Time: 10/01/22  8:35 AM   Specimen: SPU  Result Value Ref Range Status  Specimen Description   Final    SPUTUM Performed at [facility], [address] [place], [place] [address]    Special Requests   Final    NONE Reflexed from [id number] Performed at [facility], [address] [place], [place] [address]    Gram Stain NO WBC SEEN NO ORGANISMS SEEN   Final   Culture   Final    CULTURE REINCUBATED FOR BETTER GROWTH Performed at [facility], [address] [place], [place] [address]    Report Status PENDING  Incomplete     Time coordinating discharge: Over 30  minutes  SIGNED:   Willeen Niece, MD  [facility] 10/03/2022, 3:00 PM Pager   If 7PM-7AM, please contact night-coverage

## 2022-10-03 NOTE — Discharge Instructions (Addendum)
Advised to take Omnicef 300 mg daily for 3 more days. Advised to take Decadron 6 mg daily for 3 more days. Advised to continue hemodialysis as per schedule.  Se recomienda tomar Omnicef 300 mg al da durante 3 das ms. Se recomienda tomar Decadron 6 mg al da durante 3 das ms. Se recomienda continuar con hemodilisis segn cronograma.

## 2022-10-03 NOTE — Progress Notes (Signed)
1345 Nurse unable to discharge patient because  MD sent meds to [facility] and they are closed today. I called pharmacist and she said she asked him which meds he wanted and he only told them about cefdnir. They can bring that to the room and 3 tabs of the dexamethasone. All other meds must be sent to an outside pharmacy of patients choice or else pt will have to come back on Monday to pick those up. Her son wants to know why she is not having dialysis today before discharging and where will she go for dialysis on Tuesday. No instructions from the doctor about HD she says.

## 2022-10-04 ENCOUNTER — Other Ambulatory Visit: Payer: Self-pay

## 2022-10-04 LAB — CULTURE, BLOOD (ROUTINE X 2)
Culture: NO GROWTH
Culture: NO GROWTH
Special Requests: ADEQUATE

## 2022-10-04 LAB — CULTURE, RESPIRATORY W GRAM STAIN

## 2022-10-06 ENCOUNTER — Other Ambulatory Visit: Payer: Self-pay

## 2022-10-07 ENCOUNTER — Other Ambulatory Visit: Payer: Self-pay
